# Patient Record
Sex: Male | Born: 1960 | Race: White | Hispanic: No | Marital: Married | Smoking: Former smoker
Health system: Southern US, Community
[De-identification: ages and names within clinical notes are randomized; demographics above are authoritative.]

## PROBLEM LIST (undated history)

## (undated) DIAGNOSIS — E11319 Type 2 diabetes mellitus with unspecified diabetic retinopathy without macular edema: Secondary | ICD-10-CM

## (undated) DIAGNOSIS — E1129 Type 2 diabetes mellitus with other diabetic kidney complication: Secondary | ICD-10-CM

## (undated) DIAGNOSIS — E782 Mixed hyperlipidemia: Secondary | ICD-10-CM

## (undated) DIAGNOSIS — G4733 Obstructive sleep apnea (adult) (pediatric): Secondary | ICD-10-CM

## (undated) DIAGNOSIS — I429 Cardiomyopathy, unspecified: Secondary | ICD-10-CM

## (undated) DIAGNOSIS — I484 Atypical atrial flutter: Secondary | ICD-10-CM

## (undated) DIAGNOSIS — E119 Type 2 diabetes mellitus without complications: Secondary | ICD-10-CM

## (undated) DIAGNOSIS — E669 Obesity, unspecified: Secondary | ICD-10-CM

## (undated) DIAGNOSIS — I10 Essential (primary) hypertension: Secondary | ICD-10-CM

## (undated) DIAGNOSIS — I4819 Other persistent atrial fibrillation: Secondary | ICD-10-CM

## (undated) DIAGNOSIS — R809 Proteinuria, unspecified: Secondary | ICD-10-CM

## (undated) DIAGNOSIS — N200 Calculus of kidney: Secondary | ICD-10-CM

## (undated) HISTORY — DX: Type 2 diabetes mellitus with unspecified diabetic retinopathy without macular edema: E11.319

## (undated) HISTORY — PX: LITHOTRIPSY: SUR834

## (undated) HISTORY — DX: Cardiomyopathy, unspecified: I42.9

## (undated) HISTORY — DX: Obesity, unspecified: E66.9

## (undated) HISTORY — DX: Proteinuria, unspecified: E11.29

## (undated) HISTORY — DX: Atypical atrial flutter: I48.4

## (undated) HISTORY — DX: Type 2 diabetes mellitus with other diabetic kidney complication: R80.9

## (undated) HISTORY — DX: Mixed hyperlipidemia: E78.2

## (undated) HISTORY — DX: Obstructive sleep apnea (adult) (pediatric): G47.33

## (undated) HISTORY — DX: Calculus of kidney: N20.0

---

## 2007-07-02 ENCOUNTER — Encounter: Admission: RE | Admit: 2007-07-02 | Discharge: 2007-07-02 | Payer: Self-pay | Admitting: Family Medicine

## 2011-07-12 ENCOUNTER — Ambulatory Visit (INDEPENDENT_AMBULATORY_CARE_PROVIDER_SITE_OTHER): Payer: BC Managed Care – PPO | Admitting: Ophthalmology

## 2011-07-12 DIAGNOSIS — H43819 Vitreous degeneration, unspecified eye: Secondary | ICD-10-CM

## 2011-07-12 DIAGNOSIS — H2510 Age-related nuclear cataract, unspecified eye: Secondary | ICD-10-CM

## 2011-07-12 DIAGNOSIS — E1139 Type 2 diabetes mellitus with other diabetic ophthalmic complication: Secondary | ICD-10-CM

## 2011-07-12 DIAGNOSIS — E11319 Type 2 diabetes mellitus with unspecified diabetic retinopathy without macular edema: Secondary | ICD-10-CM

## 2012-07-12 ENCOUNTER — Encounter (INDEPENDENT_AMBULATORY_CARE_PROVIDER_SITE_OTHER): Payer: BC Managed Care – PPO | Admitting: Ophthalmology

## 2012-07-12 DIAGNOSIS — H2510 Age-related nuclear cataract, unspecified eye: Secondary | ICD-10-CM

## 2012-07-12 DIAGNOSIS — E1139 Type 2 diabetes mellitus with other diabetic ophthalmic complication: Secondary | ICD-10-CM

## 2012-07-12 DIAGNOSIS — H43819 Vitreous degeneration, unspecified eye: Secondary | ICD-10-CM

## 2012-07-12 DIAGNOSIS — E11319 Type 2 diabetes mellitus with unspecified diabetic retinopathy without macular edema: Secondary | ICD-10-CM

## 2012-07-12 DIAGNOSIS — H35039 Hypertensive retinopathy, unspecified eye: Secondary | ICD-10-CM

## 2012-07-12 DIAGNOSIS — I10 Essential (primary) hypertension: Secondary | ICD-10-CM

## 2013-07-12 ENCOUNTER — Ambulatory Visit (INDEPENDENT_AMBULATORY_CARE_PROVIDER_SITE_OTHER): Payer: BC Managed Care – PPO | Admitting: Ophthalmology

## 2013-07-19 ENCOUNTER — Ambulatory Visit (INDEPENDENT_AMBULATORY_CARE_PROVIDER_SITE_OTHER): Payer: BC Managed Care – PPO | Admitting: Ophthalmology

## 2013-07-19 DIAGNOSIS — E11319 Type 2 diabetes mellitus with unspecified diabetic retinopathy without macular edema: Secondary | ICD-10-CM

## 2013-07-19 DIAGNOSIS — H43819 Vitreous degeneration, unspecified eye: Secondary | ICD-10-CM

## 2013-07-19 DIAGNOSIS — E1139 Type 2 diabetes mellitus with other diabetic ophthalmic complication: Secondary | ICD-10-CM

## 2013-07-19 DIAGNOSIS — I10 Essential (primary) hypertension: Secondary | ICD-10-CM

## 2013-07-19 DIAGNOSIS — H2510 Age-related nuclear cataract, unspecified eye: Secondary | ICD-10-CM

## 2013-07-19 DIAGNOSIS — H35039 Hypertensive retinopathy, unspecified eye: Secondary | ICD-10-CM

## 2014-07-22 ENCOUNTER — Ambulatory Visit (INDEPENDENT_AMBULATORY_CARE_PROVIDER_SITE_OTHER): Payer: BC Managed Care – PPO | Admitting: Ophthalmology

## 2014-07-22 DIAGNOSIS — H35033 Hypertensive retinopathy, bilateral: Secondary | ICD-10-CM

## 2014-07-22 DIAGNOSIS — E11329 Type 2 diabetes mellitus with mild nonproliferative diabetic retinopathy without macular edema: Secondary | ICD-10-CM

## 2014-07-22 DIAGNOSIS — I10 Essential (primary) hypertension: Secondary | ICD-10-CM

## 2014-07-22 DIAGNOSIS — H43813 Vitreous degeneration, bilateral: Secondary | ICD-10-CM

## 2014-07-22 DIAGNOSIS — E11319 Type 2 diabetes mellitus with unspecified diabetic retinopathy without macular edema: Secondary | ICD-10-CM

## 2014-12-03 ENCOUNTER — Encounter (HOSPITAL_COMMUNITY): Payer: Self-pay | Admitting: Emergency Medicine

## 2014-12-03 ENCOUNTER — Emergency Department (HOSPITAL_COMMUNITY)
Admission: EM | Admit: 2014-12-03 | Discharge: 2014-12-03 | Disposition: A | Discharge status: Home / Self Care | Payer: 59 | Attending: Emergency Medicine | Admitting: Emergency Medicine

## 2014-12-03 DIAGNOSIS — I10 Essential (primary) hypertension: Secondary | ICD-10-CM | POA: Insufficient documentation

## 2014-12-03 DIAGNOSIS — Z79899 Other long term (current) drug therapy: Secondary | ICD-10-CM | POA: Diagnosis not present

## 2014-12-03 DIAGNOSIS — N200 Calculus of kidney: Secondary | ICD-10-CM | POA: Insufficient documentation

## 2014-12-03 DIAGNOSIS — R109 Unspecified abdominal pain: Secondary | ICD-10-CM | POA: Diagnosis present

## 2014-12-03 DIAGNOSIS — Z7982 Long term (current) use of aspirin: Secondary | ICD-10-CM | POA: Insufficient documentation

## 2014-12-03 DIAGNOSIS — E119 Type 2 diabetes mellitus without complications: Secondary | ICD-10-CM | POA: Diagnosis not present

## 2014-12-03 DIAGNOSIS — Z720 Tobacco use: Secondary | ICD-10-CM | POA: Insufficient documentation

## 2014-12-03 DIAGNOSIS — Z880 Allergy status to penicillin: Secondary | ICD-10-CM | POA: Diagnosis not present

## 2014-12-03 HISTORY — DX: Type 2 diabetes mellitus without complications: E11.9

## 2014-12-03 HISTORY — DX: Calculus of kidney: N20.0

## 2014-12-03 HISTORY — DX: Essential (primary) hypertension: I10

## 2014-12-03 LAB — LIPASE, BLOOD: LIPASE: 33 U/L (ref 11–59)

## 2014-12-03 LAB — CBC WITH DIFFERENTIAL/PLATELET
Basophils Absolute: 0.1 10*3/uL (ref 0.0–0.1)
Basophils Relative: 1 % (ref 0–1)
Eosinophils Absolute: 0.3 10*3/uL (ref 0.0–0.7)
Eosinophils Relative: 3 % (ref 0–5)
HEMATOCRIT: 42.3 % (ref 39.0–52.0)
HEMOGLOBIN: 14.7 g/dL (ref 13.0–17.0)
LYMPHS PCT: 35 % (ref 12–46)
Lymphs Abs: 3.8 10*3/uL (ref 0.7–4.0)
MCH: 32.2 pg (ref 26.0–34.0)
MCHC: 34.8 g/dL (ref 30.0–36.0)
MCV: 92.6 fL (ref 78.0–100.0)
MONO ABS: 0.7 10*3/uL (ref 0.1–1.0)
Monocytes Relative: 7 % (ref 3–12)
NEUTROS ABS: 6 10*3/uL (ref 1.7–7.7)
Neutrophils Relative %: 54 % (ref 43–77)
Platelets: 302 10*3/uL (ref 150–400)
RBC: 4.57 MIL/uL (ref 4.22–5.81)
RDW: 12.6 % (ref 11.5–15.5)
WBC: 10.9 10*3/uL — AB (ref 4.0–10.5)

## 2014-12-03 LAB — URINE MICROSCOPIC-ADD ON

## 2014-12-03 LAB — COMPREHENSIVE METABOLIC PANEL
ALT: 38 U/L (ref 0–53)
AST: 25 U/L (ref 0–37)
Albumin: 4.3 g/dL (ref 3.5–5.2)
Alkaline Phosphatase: 85 U/L (ref 39–117)
Anion gap: 10 (ref 5–15)
BUN: 11 mg/dL (ref 6–23)
CO2: 23 mmol/L (ref 19–32)
CREATININE: 0.72 mg/dL (ref 0.50–1.35)
Calcium: 9.8 mg/dL (ref 8.4–10.5)
Chloride: 102 mmol/L (ref 96–112)
GFR calc non Af Amer: >90 mL/min (ref >90)
Glucose, Bld: 108 mg/dL — ABNORMAL HIGH (ref 70–99)
Potassium: 3.6 mmol/L (ref 3.5–5.1)
Sodium: 135 mmol/L (ref 135–145)
Total Bilirubin: 0.9 mg/dL (ref 0.3–1.2)
Total Protein: 7.1 g/dL (ref 6.0–8.3)

## 2014-12-03 LAB — URINALYSIS, ROUTINE W REFLEX MICROSCOPIC
BILIRUBIN URINE: NEGATIVE
Glucose, UA: NEGATIVE mg/dL
KETONES UR: NEGATIVE mg/dL
Leukocytes, UA: NEGATIVE
Nitrite: NEGATIVE
PROTEIN: NEGATIVE mg/dL
Specific Gravity, Urine: 1.001 — ABNORMAL LOW (ref 1.005–1.030)
UROBILINOGEN UA: 0.2 mg/dL (ref 0.0–1.0)
pH: 6.5 (ref 5.0–8.0)

## 2014-12-03 MED ORDER — ONDANSETRON 4 MG PO TBDP: 4.0000 mg | ORAL_TABLET | Freq: Three times a day (TID) | ORAL | Status: DC | PRN

## 2014-12-03 MED ORDER — TAMSULOSIN HCL 0.4 MG PO CAPS: 0.4000 mg | ORAL_CAPSULE | Freq: Every day | ORAL | Status: DC

## 2014-12-03 MED ORDER — SODIUM CHLORIDE 0.9 % IV BOLUS (SEPSIS)
1000.0000 mL | Freq: Once | INTRAVENOUS | Status: AC
  Administered 2014-12-03: 1000 mL via INTRAVENOUS

## 2014-12-03 MED ORDER — OXYCODONE-ACETAMINOPHEN 5-325 MG PO TABS: 1.0000 | ORAL_TABLET | Freq: Four times a day (QID) | ORAL | Status: DC | PRN

## 2014-12-03 MED ORDER — OXYCODONE-ACETAMINOPHEN 5-325 MG PO TABS
2.0000 | ORAL_TABLET | Freq: Once | ORAL | Status: AC
  Administered 2014-12-03: 2 via ORAL
  Filled 2014-12-03: qty 2

## 2014-12-03 NOTE — ED Notes (Signed)
Pt reports hx of kidney stones, feels like same today. Pt reports he has to push in order to urinate. Hx of kidney stone blocking urethra. Still able to urinate

## 2014-12-03 NOTE — Discharge Instructions (Signed)
If you develop fever, pain that is uncontrolled, vomiting that will not stop, are unable to urinate, please return to the hospital.   Kidney Stones Kidney stones (urolithiasis) are deposits that form inside your kidneys. The intense pain is caused by the stone moving through the urinary tract. When the stone moves, the ureter goes into spasm around the stone. The stone is usually passed in the urine.  CAUSES   A disorder that makes certain neck glands produce too much parathyroid hormone (primary hyperparathyroidism).  A buildup of uric acid crystals, similar to gout in your joints.  Narrowing (stricture) of the ureter.  A kidney obstruction present at birth (congenital obstruction).  Previous surgery on the kidney or ureters.  Numerous kidney infections. SYMPTOMS   Feeling sick to your stomach (nauseous).  Throwing up (vomiting).  Blood in the urine (hematuria).  Pain that usually spreads (radiates) to the groin.  Frequency or urgency of urination. DIAGNOSIS   Taking a history and physical exam.  Blood or urine tests.  CT scan.  Occasionally, an examination of the inside of the urinary bladder (cystoscopy) is performed. TREATMENT   Observation.  Increasing your fluid intake.  Extracorporeal shock wave lithotripsy--This is a noninvasive procedure that uses shock waves to break up kidney stones.  Surgery may be needed if you have severe pain or persistent obstruction. There are various surgical procedures. Most of the procedures are performed with the use of small instruments. Only small incisions are needed to accommodate these instruments, so recovery time is minimized. The size, location, and chemical composition are all important variables that will determine the proper choice of action for you. Talk to your health care provider to better understand your situation so that you will minimize the risk of injury to yourself and your kidney.  HOME CARE INSTRUCTIONS    Drink enough water and fluids to keep your urine clear or pale yellow. This will help you to pass the stone or stone fragments.  Strain all urine through the provided strainer. Keep all particulate matter and stones for your health care provider to see. The stone causing the pain may be as small as a grain of salt. It is very important to use the strainer each and every time you pass your urine. The collection of your stone will allow your health care provider to analyze it and verify that a stone has actually passed. The stone analysis will often identify what you can do to reduce the incidence of recurrences.  Only take over-the-counter or prescription medicines for pain, discomfort, or fever as directed by your health care provider.  Make a follow-up appointment with your health care provider as directed.  Get follow-up X-rays if required. The absence of pain does not always mean that the stone has passed. It may have only stopped moving. If the urine remains completely obstructed, it can cause loss of kidney function or even complete destruction of the kidney. It is your responsibility to make sure X-rays and follow-ups are completed. Ultrasounds of the kidney can show blockages and the status of the kidney. Ultrasounds are not associated with any radiation and can be performed easily in a matter of minutes. SEEK MEDICAL CARE IF:  You experience pain that is progressive and unresponsive to any pain medicine you have been prescribed. SEEK IMMEDIATE MEDICAL CARE IF:   Pain cannot be controlled with the prescribed medicine.  You have a fever or shaking chills.  The severity or intensity of pain increases over 18  hours and is not relieved by pain medicine.  You develop a new onset of abdominal pain.  You feel faint or pass out.  You are unable to urinate. MAKE SURE YOU:   Understand these instructions.  Will watch your condition.  Will get help right away if you are not doing well  or get worse. Document Released: 07/25/2005 Document Revised: 03/27/2013 Document Reviewed: 12/26/2012 [facility] Patient Information 2015 [place], [place]. This information is not intended to replace advice given to you by your health care provider. Make sure you discuss any questions you have with your health care provider.  Dietary Guidelines to Help Prevent Kidney Stones Your risk of kidney stones can be decreased by adjusting the foods you eat. The most important thing you can do is drink enough fluid. You should drink enough fluid to keep your urine clear or pale yellow. The following guidelines provide specific information for the type of kidney stone you have had. GUIDELINES ACCORDING TO TYPE OF KIDNEY STONE Calcium Oxalate Kidney Stones  Reduce the amount of salt you eat. Foods that have a lot of salt cause your body to release excess calcium into your urine. The excess calcium can combine with a substance called oxalate to form kidney stones.  Reduce the amount of animal protein you eat if the amount you eat is excessive. Animal protein causes your body to release excess calcium into your urine. Ask your dietitian how much protein from animal sources you should be eating.  Avoid foods that are high in oxalates. If you take vitamins, they should have less than 500 mg of vitamin C. Your body turns vitamin C into oxalates. You do not need to avoid fruits and vegetables high in vitamin C. Calcium Phosphate Kidney Stones  Reduce the amount of salt you eat to help prevent the release of excess calcium into your urine.  Reduce the amount of animal protein you eat if the amount you eat is excessive. Animal protein causes your body to release excess calcium into your urine. Ask your dietitian how much protein from animal sources you should be eating.  Get enough calcium from food or take a calcium supplement (ask your dietitian for recommendations). Food sources of calcium that do not increase your  risk of kidney stones include:  Broccoli.  Dairy products, such as cheese and yogurt.  Pudding. Uric Acid Kidney Stones  Do not have more than 6 oz of animal protein per day. FOOD SOURCES Animal Protein Sources  Meat (all types).  Poultry.  Eggs.  Fish, seafood. Foods High in MirantSalt  Salt seasonings.  Soy sauce.  Teriyaki sauce.  Cured and processed meats.  Salted crackers and snack foods.  Fast food.  Canned soups and most canned foods. Foods High in Oxalates  Grains:  Amaranth.  Barley.  Grits.  Wheat germ.  Bran.  Buckwheat flour.  All bran cereals.  Pretzels.  Whole wheat bread.  Vegetables:  Beans (wax).  Beets and beet greens.  Collard greens.  Eggplant.  Escarole.  Leeks.  Okra.  Parsley.  Rutabagas.  Spinach.  Swiss chard.  Tomato paste.  Fried potatoes.  Sweet potatoes.  Fruits:  Red currants.  Figs.  Kiwi.  Rhubarb.  Meat and Other Protein Sources:  Beans (dried).  Soy burgers and other soybean products.  Miso.  Nuts (peanuts, almonds, pecans, cashews, hazelnuts).  Nut butters.  Sesame seeds and tahini (paste made of sesame seeds).  Poppy seeds.  Beverages:  Chocolate drink mixes.  Soy  milk.  Instant iced tea.  Juices made from high-oxalate fruits or vegetables.  Other:  Carob.  Chocolate.  Fruitcake.  Marmalades. Document Released: 11/19/2010 Document Revised: 07/30/2013 Document Reviewed: 06/21/2013 [facility] Patient Information 2015 [place], [place]. This information is not intended to replace advice given to you by your health care provider. Make sure you discuss any questions you have with your health care provider.

## 2014-12-03 NOTE — ED Notes (Signed)
Pt reports known kidney stone x4 weeks, today began having intermittent lower pelvic pain w/ associated dysuria, pt states he has difficulty urinating and produces only a small amount of urine at a time. Pt admits to hx of having kidney stones stuck lower in his urinary track causing dysuria. Pt admits to pain w/ movement and urination. Pt in no acute distress on assessment, A&Ox4.

## 2014-12-03 NOTE — ED Provider Notes (Signed)
TIME SEEN: 8:05 PM  CHIEF COMPLAINT: Right flank pain  HPI: Pt is a 54 y.o. male with history of kidney stones, hypertension, diabetes who presents to the emergency department with penile pain, dysuria. States that 4 weeks ago he was having right flank pain that he felt like was similar to his prior kidney stones. He states that he drank large amounts of water and felt like the stone moved into his bladder. He states that he has not noticed the stone passage today he began drug and water again and then fell at the stone was moving into his urethra. He states however the pain is Worse and is worse with urination and now he is having a hard time urinating and has to push hard to be able to urinate. He denies that he is having any fevers, chills, nausea, vomiting or diarrhea. States he has had multiple kidney stones. Has had prior ureteral stents. Has also had a previous obstructing [place] of [place] and [place] undergo procedure to break the stone up.  [place]. Denies penile discharge, testicular pain or swelling.  ROS: See HPI Constitutional: no fever  Eyes: no drainage  ENT: no runny nose   Cardiovascular:  no chest pain  Resp: no SOB  GI: no vomiting GU: no dysuria Integumentary: no rash  Allergy: no hives  Musculoskeletal: no leg swelling  Neurological: no slurred speech ROS otherwise negative  PAST MEDICAL HISTORY/PAST SURGICAL HISTORY:  Past Medical History  Diagnosis Date  . Kidney stone   . Diabetes mellitus without complication   . Hypertension     MEDICATIONS:  Prior to Admission medications   Medication Sig Start Date End Date Taking? Authorizing Provider  aspirin EC 81 MG tablet Take 81 mg by mouth daily.   Yes Historical Provider, MD  Flaxseed, Linseed, (EQL FLAX SEED OIL) 1000 MG CAPS Take 2,000 mg by mouth 2 (two) times daily.   Yes Historical Provider, MD  lisinopril (PRINIVIL,ZESTRIL) 20 MG tablet Take 20  mg by mouth 2 (two) times daily.   Yes Historical Provider, MD  metFORMIN (GLUCOPHAGE) 1000 MG tablet Take 1,000 mg by mouth 2 (two) times daily with a meal.   Yes Historical Provider, MD  Multiple Vitamin (MULTIVITAMIN WITH MINERALS) TABS tablet Take 1 tablet by mouth daily.   Yes Historical Provider, MD  potassium citrate (UROCIT-K) 10 MEQ (1080 MG) SR tablet Take 20 mEq by mouth 2 (two) times daily.   Yes Historical Provider, MD  VIAGRA 50 MG tablet Take 1 tablet by mouth daily as needed (prior to intercourse.).  09/08/14  Yes Historical Provider, MD    ALLERGIES:  Allergies  Allergen Reactions  . Penicillins Swelling    SOCIAL HISTORY:  History  Substance Use Topics  . Smoking status: Current Every Day Smoker  . Smokeless tobacco: Not on file  . Alcohol Use: No    FAMILY HISTORY: History reviewed. No pertinent family history.  EXAM: BP 116/85 mmHg  Pulse 63  Temp(Src) 97.7 F (36.5 C) (Oral)  Resp 20  SpO2 97% CONSTITUTIONAL: Alert and oriented and responds appropriately to questions. Well-appearing; well-nourished HEAD: Normocephalic EYES: Conjunctivae clear, PERRL ENT: normal nose; no rhinorrhea; moist mucous membranes; pharynx without lesions noted NECK: Supple, no meningismus, no LAD  CARD: RRR; S1 and S2 appreciated; no murmurs, no clicks, no rubs, no gallops RESP: Normal chest excursion without splinting or tachypnea; breath sounds clear and equal bilaterally;  no wheezes, no rhonchi, no rales ABD/GI: Normal bowel sounds; non-distended; soft, non-tender, no rebound, no guarding GU:  Normal external genitalia, circumcised male, no penile discharge, normal urethral meatus, no testicular masses or tenderness, no scrotal masses, 2+ femoral pulses bilaterally, no hernia BACK:  The back appears normal and is non-tender to palpation, there is no CVA tenderness EXT: Normal ROM in all joints; non-tender to palpation; no edema; normal capillary refill; no cyanosis    SKIN:  Normal color for age and race; warm NEURO: Moves all extremities equally PSYCH: The patient's mood and manner are appropriate. Grooming and personal hygiene are appropriate.  MEDICAL DECISION MAKING: Patient here with what he feels like is a urethral stone. He is able to urinate but states is very difficult. He has a mild leukocytosis today. His creatinine is normal. Urine shows hemoglobin without other sign of infection. We'll discuss with urology on call for recommendations. We'll give IV fluids. He denies pain and nausea medicine at this time.  ED PROGRESS:   Discussed with Dr. Laural Benes with urology. He feels that given the patient is able to empty his bladder and has normal creatinine, pain control, no vomiting, afebrile that he can follow-up as an outpatient. Discussed this with patient who is okay with this plan. Will give dose of pain medicine in the ED as he states that it is painful when he walks. Have discussed with urology whether not we should get imaging today and they state they will follow the patient up as an outpatient and can decide if he needs imaging at that time. Dr. Laural Benes requesting a post void residual.   Patient visualized 126 mL. He reports if it was greater than 300 patient may need to be taken to the operating room tonight but he feels the patient can be followed as an outpatient. Discussed return precautions. He verbalizes understanding and is comfortable with plan.     Layla Maw Tyshawn Keel, DO 12/03/14 2102

## 2015-03-31 ENCOUNTER — Encounter: Payer: Self-pay | Admitting: Dietician

## 2015-03-31 ENCOUNTER — Encounter: Payer: 59 | Attending: Family Medicine | Admitting: Dietician

## 2015-03-31 VITALS — Ht 72.0 in | Wt 279.0 lb

## 2015-03-31 DIAGNOSIS — Z713 Dietary counseling and surveillance: Secondary | ICD-10-CM | POA: Diagnosis not present

## 2015-03-31 DIAGNOSIS — Z6837 Body mass index (BMI) 37.0-37.9, adult: Secondary | ICD-10-CM | POA: Insufficient documentation

## 2015-03-31 DIAGNOSIS — E669 Obesity, unspecified: Secondary | ICD-10-CM | POA: Diagnosis not present

## 2015-03-31 DIAGNOSIS — E119 Type 2 diabetes mellitus without complications: Secondary | ICD-10-CM

## 2015-03-31 NOTE — Progress Notes (Signed)
Diabetes Self-Management Education  Visit Type: First/Initial  Appt. Start Time: 1515 Appt. End Time: 1630  03/31/2015  Mr. Jose Cain, identified by name and date of birth, is a 54 y.o. male with a diagnosis of Diabetes: Type 2. He has had type 2 diabetes for about 10 years.  His father has type 1 diabetes and uses an insulin pump.  Hx includes HTN,and obesity.    Patient lives with his wife.  Both shop and cook.  He states that he is confused about what foods are good to eat and what are not to control his blood sugar and lose weight.  He lost weight in the past but then regained it all over time.  They have started walking each evening after work for 30-45 minutes consistently either in the neighborhood or on the treadmill or bike at home.  He has lost from 295 lbs in January to current weight of 279 lbs.  He stated that he believes that he can eat a small amount of what he wishes otherwise he tends to feel deprived and overeat that item.   He continues to smoke.  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  Discussed healthy diet, weight loss, and continued exercise as means to improve diabetes, HTN, and weight.  ASSESSMENT  Height 6' (1.829 m), weight 279 lb (126.554 kg). Body mass index is 37.83 kg/(m^2).      Diabetes Self-Management Education - 03/31/15 1636    Visit Information   Visit Type First/Initial   Initial Visit   Diabetes Type Type 2   Are you currently following a meal plan? No   Are you taking your medications as prescribed? Yes  used to forget on the weekends but doing much better with this at this time.  Takes Metformin on empty stomach but no side effects   Date Diagnosed about 10 years ago   Health Coping   How would you rate your overall health? Excellent   Psychosocial Assessment   Patient Belief/Attitude about Diabetes Other (comment)  I don't really think about it that much.   Self-care barriers None   Self-management support Doctor's office;Family   Other persons present Patient;Spouse/SO   Patient Concerns Nutrition/Meal planning;Weight Control   Special Needs None   Preferred Learning Style No preference indicated   Learning Readiness Ready   How often do you need to have someone help you when you read instructions, pamphlets, or other written materials from your doctor or pharmacy? 1 - Never   What is the last grade level you completed in school? 2 years college   Complications   Last HgB A1C per patient/outside source 7.3 %  02/19/15   How often do you check your blood sugar? 1-2 times/day  5 times per week   Fasting Blood glucose range (mg/dL) 96-045   Number of hypoglycemic episodes per month 0   Number of hyperglycemic episodes per week 0   Have you had a dilated eye exam in the past 12 months? Yes   Have you had a dental exam in the past 12 months? Yes   Are you checking your feet? Yes   How many days per week are you checking your feet? 7   Dietary Intake   Breakfast skips as he is not hungry or bowl of canteloupe   Snack (morning) none   Lunch boiled egg, yogurt, fruit or sandwich or soup or salad brought from home  Snack (afternoon) pickle, spoon of a leftover when he gets home from work.  He used to eat NABs but does not buy them nor miss them   Dinner meat and vegetables   Snack (evening) Used to buy a large bag of chips and eat all of it.  NOW 1 scoop of ice cream or a cookie occasionally   Beverage(s) water, occasional lemonade   Exercise   Exercise Type Light (walking / raking leaves)   How many days per week to you exercise? 7   How many minutes per day do you exercise? 30  30-45   Total minutes per week of exercise 210   Patient Education   Previous Diabetes Education No   Disease state  Definition of diabetes, type 1 and 2, and the diagnosis of diabetes   Nutrition management  Role of diet in the treatment of diabetes and the relationship between the three  main macronutrients and blood glucose level;Food label reading, portion sizes and measuring food.;Carbohydrate counting;Reviewed blood glucose goals for pre and post meals and how to evaluate the patients' food intake on their blood glucose level.   Physical activity and exercise  Role of exercise on diabetes management, blood pressure control and cardiac health.   Medications Reviewed patients medication for diabetes, action, purpose, timing of dose and side effects.   Monitoring Identified appropriate SMBG and/or A1C goals.;Daily foot exams;Yearly dilated eye exam;Other (comment)  evaluating blood sugar based on different meal composition   Chronic complications Relationship between chronic complications and blood glucose control;Lipid levels, blood glucose control and heart disease   Personal strategies to promote health Review risk of smoking and offered smoking cessation   Individualized Goals (developed by patient)   Nutrition General guidelines for healthy choices and portions discussed   Physical Activity Exercise 5-7 days per week;30 minutes per day;45 minutes per day   Medications take my medication as prescribed   Monitoring  test my blood glucose as discussed   Reducing Risk stop smoking;do foot checks daily   Health Coping Not Applicable   Outcomes   Expected Outcomes Demonstrated interest in learning. Expect positive outcomes   Future DMSE PRN   Program Status Completed      Individualized Plan for Diabetes Self-Management Training:   Learning Objective:  Patient will have a greater understanding of diabetes self-management. Patient education plan is to attend individual and/or group sessions per assessed needs and concerns.   Plan:   Patient Instructions  Randie Heinz job on the changes that you have made!  Regular exercise and changing what you drink. Continue the exercise. Recommend eating breakfast.   Have protein in moderation with each meal and snack. Aim for 30-45 grams  carbohydrates at each meal. Aim for 0-15 grams of carbohydrate at each snack.   Expected Outcomes:  Demonstrated interest in learning. Expect positive outcomes  Education material provided: Living Well with Diabetes, Food label handouts, A1C conversion sheet, Meal plan card and Snack sheet , breakfast ideas  If problems or questions, patient to contact team via:  Phone and Email  Future DSME appointment: PRN

## 2015-03-31 NOTE — Patient Instructions (Signed)
Great job on the changes that you have made!  Regular exercise and changing what you drink. Continue the exercise. Recommend eating breakfast.   Have protein in moderation with each meal and snack. Aim for 30-45 grams carbohydrates at each meal. Aim for 0-15 grams of carbohydrate at each snack.

## 2015-07-24 ENCOUNTER — Ambulatory Visit (INDEPENDENT_AMBULATORY_CARE_PROVIDER_SITE_OTHER): Payer: 59 | Admitting: Ophthalmology

## 2015-07-24 DIAGNOSIS — I10 Essential (primary) hypertension: Secondary | ICD-10-CM | POA: Diagnosis not present

## 2015-07-24 DIAGNOSIS — H35033 Hypertensive retinopathy, bilateral: Secondary | ICD-10-CM | POA: Diagnosis not present

## 2015-07-24 DIAGNOSIS — E11319 Type 2 diabetes mellitus with unspecified diabetic retinopathy without macular edema: Secondary | ICD-10-CM | POA: Diagnosis not present

## 2015-07-24 DIAGNOSIS — H43813 Vitreous degeneration, bilateral: Secondary | ICD-10-CM | POA: Diagnosis not present

## 2015-07-24 DIAGNOSIS — E113293 Type 2 diabetes mellitus with mild nonproliferative diabetic retinopathy without macular edema, bilateral: Secondary | ICD-10-CM | POA: Diagnosis not present

## 2016-07-26 ENCOUNTER — Ambulatory Visit (INDEPENDENT_AMBULATORY_CARE_PROVIDER_SITE_OTHER): Payer: 59 | Admitting: Ophthalmology

## 2016-07-26 DIAGNOSIS — H43813 Vitreous degeneration, bilateral: Secondary | ICD-10-CM | POA: Diagnosis not present

## 2016-07-26 DIAGNOSIS — E11319 Type 2 diabetes mellitus with unspecified diabetic retinopathy without macular edema: Secondary | ICD-10-CM | POA: Diagnosis not present

## 2016-07-26 DIAGNOSIS — I10 Essential (primary) hypertension: Secondary | ICD-10-CM

## 2016-07-26 DIAGNOSIS — E113293 Type 2 diabetes mellitus with mild nonproliferative diabetic retinopathy without macular edema, bilateral: Secondary | ICD-10-CM | POA: Diagnosis not present

## 2016-07-26 DIAGNOSIS — H35033 Hypertensive retinopathy, bilateral: Secondary | ICD-10-CM

## 2017-07-26 ENCOUNTER — Ambulatory Visit (INDEPENDENT_AMBULATORY_CARE_PROVIDER_SITE_OTHER): Payer: 59 | Admitting: Ophthalmology

## 2017-07-26 DIAGNOSIS — H35033 Hypertensive retinopathy, bilateral: Secondary | ICD-10-CM

## 2017-07-26 DIAGNOSIS — I10 Essential (primary) hypertension: Secondary | ICD-10-CM

## 2017-07-26 DIAGNOSIS — H2513 Age-related nuclear cataract, bilateral: Secondary | ICD-10-CM | POA: Diagnosis not present

## 2017-07-26 DIAGNOSIS — E113293 Type 2 diabetes mellitus with mild nonproliferative diabetic retinopathy without macular edema, bilateral: Secondary | ICD-10-CM

## 2017-07-26 DIAGNOSIS — E11319 Type 2 diabetes mellitus with unspecified diabetic retinopathy without macular edema: Secondary | ICD-10-CM

## 2017-07-26 DIAGNOSIS — H43813 Vitreous degeneration, bilateral: Secondary | ICD-10-CM

## 2017-12-08 NOTE — H&P (Signed)
Jose Cain 17-Dec-2017 9:00 AM Location: [facility] Patient #: [id number] DOB: 1961/03/05 Married / Language: [place] / Race: White Male   History of Present Illness Jose Clines Amillia Biffle MD; Dec 17, 2017 10:11 AM) Patient words: NP EVAL for lower extremity edema.  The patient is a 57 year old male who presents for shortness of breath. Patient referred for evaluation of by Brett Fairy, MD  57 year old Caucasian male with hypertension, controlled type 2 DM, moderate obesity, OSA on CPAP. 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 He is noticed shortness of breath on walking uphill since December. He says he had gained a lot of weight, which has come down with use of Lasix. I'll his exertional dyspnea is somewhat better, but still persists. He has noticed bilateral leg edema, again somewhat better after using Lasix. He denies any palpitations, chest pain, orthopnea, PND.  He is a former smoker, quit in 2016. He has family history of CAD in his mother. Patient had a screening colonoscopy 4 years ago. He was found to have some polyps but did not undergo any polypectomy. He was recommended to have colonoscopy done sometime later this year.   Problem List/Past Medical Osvaldo Human Johnson; 12/17/17 9:08 AM) Laboratory examination (Z01.89)  Benign essential hypertension (I10)  Controlled type 2 diabetes mellitus without complication, without long-term current use of insulin (E11.9)   Allergies Osvaldo Human Johnson; Dec 17, 2017 9:09 AM) penicillAMINE *MISCELLANEOUS THERAPEUTIC CLASSES*  rash  Family History Osvaldo Human Johnson; Dec 17, 2017 9:10 AM) Mother  Deceased. at age 7; no heart attacks/strokes; Angioplasty Father  In good health. 72 , DM; no heart issues Brother 1  In good health. older; no heart issues  Social History Elease Etienne; 12/17/17 9:15  AM) Current tobacco use  Former smoker. quit 2016; somed about 1/2 pack daily Alcohol Use  Occasional alcohol use, Drinks wine. Marital status  Married. Living Situation  Lives with spouse. Number of Children  3.  Past Surgical History Elease Etienne; 12-17-17 9:16 AM) None [2017-12-17]:  Medication History Osvaldo Human Johnson; 12/17/2017 9:15 AM) Furosemide (  Tablet, Oral as needed) Active. Losartan Potassium (  Tablet, 1 Oral daily) Active. amLODIPine Besylate (  Tablet, 1 Oral daily) Active. metFORMIN HCl (  Tablet, 1 Oral two times daily) Active. Aspirin (  Capsule, 1 Oral daily) Active. Cetirizine HCl (  Tablet, 1 Oral daily) Active. Flaxseed Oil (2 Oral two times daily) Specific strength unknown - Active. Multivitamin Adults 50+ (1 Oral daily) Active. Medications Reconciled (meds present)  Diagnostic Studies History Osvaldo Human Johnson; Dec 17, 2017 9:16 AM) Colonoscopy [2015]: few polyphs Echocardiogram [2009]:    Review of Systems [facility] Jianni Shelden MD; Dec 17, 2017 10:10 AM) General Not Present- Appetite Loss and Weight Gain. Respiratory Not Present- Chronic Cough and Wakes up from Sleep Wheezing or Short of Breath. Cardiovascular Present- Difficulty Breathing On Exertion, Edema and Hypertension (Controlled). Not Present- Chest Pain, Claudications, Difficulty Breathing Lying Down, Orthopnea, Palpitations and Rapid Heart Rate (not perceieved by the patient.). Gastrointestinal Not Present- Black, Tarry Stool and Difficulty Swallowing. Musculoskeletal Not Present- Decreased Range of Motion and Muscle Atrophy. Neurological Not Present- Attention Deficit. Psychiatric Not Present- Personality Changes and Suicidal Ideation. Endocrine Not Present- Cold Intolerance and Heat Intolerance. Hematology Not Present- Abnormal Bleeding. All other systems negative  Vitals Osvaldo Human Johnson; 12-17-17 9:11 AM) 2017/12/17 9:05 AM Weight: 281 lb Height: 72in Body  Surface Area: 2.46 m Body Mass Index: 38.11  kg/m  Pulse: 157 (Regular)  P.OX: 98% (Room air) BP: 118/68 (Sitting, Left Arm, Standard)       Physical Exam Jose Clines Elfriede Bonini MD; 12/08/2017 10:11 AM) General Mental Status-Alert. General Appearance-Cooperative and Appears stated age. Build & Nutrition-Moderately obese.  Head and Neck Thyroid Gland Characteristics - normal size and consistency and no palpable nodules. Note: Large neck. Malampatti score 2   Chest and Lung Exam Chest and lung exam reveals -quiet, even and easy respiratory effort with no use of accessory muscles, non-tender and on auscultation, normal breath sounds, no adventitious sounds.  Cardiovascular Cardiovascular examination reveals -normal heart sounds, regular rate and rhythm with no murmurs, carotid auscultation reveals no bruits, abdominal aorta auscultation reveals no bruits and no prominent pulsation and femoral artery auscultation bilaterally reveals normal pulses, no bruits, no thrills.  Abdomen Palpation/Percussion Palpation and Percussion of the abdomen reveal - Non Tender and No hepatosplenomegaly.  Peripheral Vascular Lower Extremity Palpation - Edema - Bilateral - 2+ Pitting edema. Dorsalis pedis pulse - Bilateral - 2+. Posterior tibial pulse - Bilateral - 1+. Carotid arteries - Bilateral-No Carotid bruit.  Neurologic Neurologic evaluation reveals -alert and oriented x 3 with no impairment of recent or remote memory. Motor-Grossly intact without any focal deficits.  Musculoskeletal Global Assessment Left Lower Extremity - no deformities, masses or tenderness, no known fractures. Right Lower Extremity - no deformities, masses or tenderness, no known fractures.    Assessment & Plan [facility] Azaryah Oleksy MD; 12/08/2017 11:05 AM) Atrial flutter with rapid ventricular response (I48.92) Story: EKG 12/08/2017: Atypical atrial flutter with variable conduction. Average ventricular  rate 140 bpm. Right bundle branch block, left anterior fascicular block. Nonspecific ST-T changes. Low-voltage.  CHA2DS2VASc score 2: Annual stroke risk 2.2% Current Plans Started Xarelto , 1 (one) Tablet Once daily, #30, 30 days starting 12/08/2017, Ref. x2. Started dilTIAZem HCl ER , 1 (one) Capsule once daily, #60, 60 days starting 12/08/2017, Ref. x3. METABOLIC PANEL, BASIC [phone]) Complete electrocardiogram (93000) Future Plans 12/22/2017: Myocardial perfusion imaging, tomographic (SPECT) (including attenuation correction, qualitative or quantitative wall motion, ejection fraction by first pass or gated technique, additional quantification, when performed) - one time 12/29/2017: Echocardiography, transthoracic, real-time with image documentation (2D), includes M-mode recording, when performed, complete, with spectral Doppler echocardiography, and with color flow Doppler echocardiography (60454) - one time OSA on CPAP (G47.33) Benign essential hypertension (I10) Controlled type 2 diabetes mellitus without complication, without long-term current use of insulin (E11.9) Moderate obesity (E66.8)  Note:Recommendations:  57 year old Caucasian male with hypertension, controlled type 2 DM, moderate obesity, OSA on CPAP.  New diagnosis of atypical atrial flutter with RVR.  History is factors are hypertension, type II diabetes mellitus, morbid obesity and also to sleep apnea. With his moderate stroke risk with CHADSVASc Score of 2, I recommend anticoagulation with Xarelto 20 mg daily. I discussed the options of various rhythm control and rate control strategies with the patient. But this atypical flutter, this may not be amenable to ablation. Nevertheless, we can consider ablation down the road. However, at this time I would recommend proceeding with TEE guided cardioversion next week, in light of his RVR. He does have right bundle branch block and left atrial vascular block while in  atrial flutter RVR. However, his AV conduction seems to be intact. I discussed the small risk of complete heart block and requiring pacemaker with the patient. He does have a large neck, but his Malampatti score is 2. I think he will do well with deep sedation, and will be  monitored by anesthesiology. In the meantime, I have started him on diltiazem 120 mg once daily.  After TEE guided cardioversion, I will perform an outpatient echocardiogram and exercise nuclear stress test to evaluate his EF while in sinus rhythm, and to rule out myocardial ischemia.   In the long term, he will need optimal control of his risk factors to avoid recurrence. Continue using CPAP. Needs aggressive weight loss.  I will see him after TEE/cardioversion, echo and stress test.  Cc Remi Deter, MD  Signed electronically by Truett Mainland, MD (12/08/2017 11:05 AM)

## 2017-12-11 NOTE — Progress Notes (Signed)
Labs 12/08/2017: Glucose 13.  BUN/creatinine 13/0.7.  EGFR normal.  Sodium 141, potassium 4.2

## 2017-12-12 ENCOUNTER — Ambulatory Visit (HOSPITAL_COMMUNITY)
Admission: RE | Admit: 2017-12-12 | Discharge: 2017-12-12 | Disposition: A | Discharge status: Home / Self Care | Payer: 59 | Source: Ambulatory Visit | Attending: Cardiology | Admitting: Cardiology

## 2017-12-12 ENCOUNTER — Encounter (HOSPITAL_COMMUNITY)
Admission: RE | Disposition: A | Discharge status: Home / Self Care | Payer: Self-pay | Source: Ambulatory Visit | Attending: Cardiology

## 2017-12-12 ENCOUNTER — Ambulatory Visit (HOSPITAL_COMMUNITY): Payer: 59 | Admitting: Certified Registered"

## 2017-12-12 ENCOUNTER — Other Ambulatory Visit: Payer: Self-pay

## 2017-12-12 ENCOUNTER — Encounter (HOSPITAL_COMMUNITY): Payer: Self-pay | Admitting: *Deleted

## 2017-12-12 ENCOUNTER — Ambulatory Visit (HOSPITAL_COMMUNITY): Payer: 59

## 2017-12-12 DIAGNOSIS — Z79899 Other long term (current) drug therapy: Secondary | ICD-10-CM | POA: Diagnosis not present

## 2017-12-12 DIAGNOSIS — E119 Type 2 diabetes mellitus without complications: Secondary | ICD-10-CM | POA: Insufficient documentation

## 2017-12-12 DIAGNOSIS — Z7982 Long term (current) use of aspirin: Secondary | ICD-10-CM | POA: Insufficient documentation

## 2017-12-12 DIAGNOSIS — Z7984 Long term (current) use of oral hypoglycemic drugs: Secondary | ICD-10-CM | POA: Diagnosis not present

## 2017-12-12 DIAGNOSIS — Z8249 Family history of ischemic heart disease and other diseases of the circulatory system: Secondary | ICD-10-CM | POA: Diagnosis not present

## 2017-12-12 DIAGNOSIS — Z87891 Personal history of nicotine dependence: Secondary | ICD-10-CM | POA: Insufficient documentation

## 2017-12-12 DIAGNOSIS — Z6837 Body mass index (BMI) 37.0-37.9, adult: Secondary | ICD-10-CM | POA: Insufficient documentation

## 2017-12-12 DIAGNOSIS — I4892 Unspecified atrial flutter: Secondary | ICD-10-CM | POA: Diagnosis present

## 2017-12-12 DIAGNOSIS — I10 Essential (primary) hypertension: Secondary | ICD-10-CM | POA: Insufficient documentation

## 2017-12-12 HISTORY — PX: CARDIOVERSION: SHX1299

## 2017-12-12 HISTORY — PX: TEE WITHOUT CARDIOVERSION: SHX5443

## 2017-12-12 LAB — GLUCOSE, CAPILLARY: Glucose-Capillary: 106 mg/dL — ABNORMAL HIGH (ref 65–99)

## 2017-12-12 SURGERY — CARDIOVERSION
Anesthesia: Monitor Anesthesia Care

## 2017-12-12 MED ORDER — LIDOCAINE HCL (CARDIAC) PF 100 MG/5ML IV SOSY
PREFILLED_SYRINGE | INTRAVENOUS | Status: DC | PRN
  Administered 2017-12-12: 40 mg via INTRAVENOUS

## 2017-12-12 MED ORDER — SODIUM CHLORIDE 0.9 % IV SOLN: INTRAVENOUS | Status: DC

## 2017-12-12 MED ORDER — PHENYLEPHRINE HCL 10 MG/ML IJ SOLN
INTRAMUSCULAR | Status: DC | PRN
Start: 2017-12-12 — End: 2017-12-12
  Administered 2017-12-12: 80 ug via INTRAVENOUS

## 2017-12-12 MED ORDER — LACTATED RINGERS IV SOLN
INTRAVENOUS | Status: DC | PRN
  Administered 2017-12-12: 13:00:00 via INTRAVENOUS

## 2017-12-12 MED ORDER — METOPROLOL TARTRATE 5 MG/5ML IV SOLN
INTRAVENOUS | Status: DC | PRN
  Administered 2017-12-12: 2.5 mg via INTRAVENOUS

## 2017-12-12 MED ORDER — PROPOFOL 10 MG/ML IV BOLUS
INTRAVENOUS | Status: DC | PRN
  Administered 2017-12-12: 40 mg via INTRAVENOUS

## 2017-12-12 MED ORDER — PROPOFOL 500 MG/50ML IV EMUL
INTRAVENOUS | Status: DC | PRN
  Administered 2017-12-12: 100 ug/kg/min via INTRAVENOUS

## 2017-12-12 NOTE — Progress Notes (Signed)
  Echocardiogram Echocardiogram Transesophageal has been performed.  Jose Cain G Jose Cain 12/12/2017, 2:30 PM

## 2017-12-12 NOTE — Discharge Instructions (Signed)

## 2017-12-12 NOTE — Anesthesia Postprocedure Evaluation (Signed)
Anesthesia Post Note  Patient: Shahiem Bedwell  Procedure(s) Performed: CARDIOVERSION (N/A ) TRANSESOPHAGEAL ECHOCARDIOGRAM (TEE) (N/A )     Patient location during evaluation: Endoscopy Anesthesia Type: MAC Level of consciousness: awake and alert Pain management: pain level controlled Vital Signs Assessment: post-procedure vital signs reviewed and stable Respiratory status: spontaneous breathing, nonlabored ventilation, respiratory function stable and patient connected to nasal cannula oxygen Cardiovascular status: stable and blood pressure returned to baseline Postop Assessment: no apparent nausea or vomiting Anesthetic complications: no    Last Vitals:  Vitals:   12/12/17 1440 12/12/17 1450  BP: 105/73 112/77  Pulse: 97 97  Resp: 15 15  Temp:    SpO2: 98% 98%    Last Pain:  Vitals:   12/12/17 1450  TempSrc:   PainSc: 0-No pain                 Barnet Glasgow

## 2017-12-12 NOTE — Interval H&P Note (Signed)
History and Physical Interval Note:  12/12/2017 1:04 PM  Jose Cain  has presented today for surgery, with the diagnosis of aflutter  The various methods of treatment have been discussed with the patient and family. After consideration of risks, benefits and other options for treatment, the patient has consented to  Procedure(s): CARDIOVERSION (N/A) TRANSESOPHAGEAL ECHOCARDIOGRAM (TEE) (N/A) as a surgical intervention .  The patient's history has been reviewed, patient examined, no change in status, stable for surgery.  I have reviewed the patient's chart and labs.  Questions were answered to the patient's satisfaction.     Jose Cain J Xiana Carns

## 2017-12-12 NOTE — Anesthesia Preprocedure Evaluation (Signed)
Anesthesia Evaluation  Patient identified by MRN, date of birth, ID band Patient awake    Reviewed: Allergy & Precautions, NPO status , Patient's Chart, lab work & pertinent test results  Airway Mallampati: II  TM Distance: >3 FB Neck ROM: Full    Dental no notable dental hx.    Pulmonary sleep apnea and Continuous Positive Airway Pressure Ventilation ,    Pulmonary exam normal breath sounds clear to auscultation       Cardiovascular Exercise Tolerance: Good hypertension, Pt. on medications Normal cardiovascular exam Rhythm:Regular Rate:Normal     Neuro/Psych negative neurological ROS  negative psych ROS   GI/Hepatic negative GI ROS, Neg liver ROS,   Endo/Other  diabetes, Type 2Morbid obesity  Renal/GU      Musculoskeletal   Abdominal (+) + obese,   Peds negative pediatric ROS (+)  Hematology negative hematology ROS (+)   Anesthesia Other Findings All: PCN  Reproductive/Obstetrics                             Anesthesia Physical Anesthesia Plan  ASA: III  Anesthesia Plan: MAC   Post-op Pain Management:    Induction:   PONV Risk Score and Plan:   Airway Management Planned: Mask, Natural Airway and Nasal Cannula  Additional Equipment:   Intra-op Plan:   Post-operative Plan:   Informed Consent: I have reviewed the patients History and Physical, chart, labs and discussed the procedure including the risks, benefits and alternatives for the proposed anesthesia with the patient or authorized representative who has indicated his/her understanding and acceptance.     Plan Discussed with:   Anesthesia Plan Comments:         Anesthesia Quick Evaluation

## 2017-12-12 NOTE — CV Procedure (Signed)
TEE: Under moderate sedation, TEE was performed without complications: LV: Normal. Normal EF. RV: Normal LA: Normal. Left atrial appendage: Normal without thrombus. Normal function. Inter atrial septum is intact without defect. Double contrast study negative for atrial level shunting. No late appearance of bubbles either. RA: Normal  MV: Mild to moderate MR. TV: Moderate TR AV: Normal. No AI or AS. PV: Normal. Trace PI.  Thoracic and ascending aorta: Normal without significant plaque or atheromatous changes.  Deep sedation administered and monitored by anesthesiology  Direct current cardioversion:  Indication symptomatic Atrial flutter  Deep sedation administered and monitored by anesthesiology. Patient was delivered with 120 Joules of electricity X 2 with success to probable sinus rhythm with first degree AV block, RBBB, LAFB.Marland KitchenPatient tolerated the procedure well. No immediate complication noted.

## 2017-12-12 NOTE — Transfer of Care (Signed)
Immediate Anesthesia Transfer of Care Note  Patient: Jose Cain  Procedure(s) Performed: CARDIOVERSION (N/A ) TRANSESOPHAGEAL ECHOCARDIOGRAM (TEE) (N/A )  Patient Location: Endoscopy Unit  Anesthesia Type:MAC  Level of Consciousness: awake, alert  and oriented  Airway & Oxygen Therapy: Patient Spontanous Breathing and Patient connected to nasal cannula oxygen  Post-op Assessment: Report given to RN, Post -op Vital signs reviewed and stable and Patient moving all extremities X 4  Post vital signs: Reviewed and stable  Last Vitals:  Vitals Value Taken Time  BP    Temp    Pulse    Resp    SpO2      Last Pain:  Vitals:   12/12/17 1310  PainSc: 0-No pain         Complications: No apparent anesthesia complications

## 2017-12-15 ENCOUNTER — Encounter (HOSPITAL_COMMUNITY): Payer: Self-pay | Admitting: Cardiology

## 2018-01-04 DIAGNOSIS — I4891 Unspecified atrial fibrillation: Secondary | ICD-10-CM

## 2018-01-04 DIAGNOSIS — E119 Type 2 diabetes mellitus without complications: Secondary | ICD-10-CM

## 2018-01-04 DIAGNOSIS — Z9989 Dependence on other enabling machines and devices: Secondary | ICD-10-CM

## 2018-01-04 DIAGNOSIS — I4892 Unspecified atrial flutter: Secondary | ICD-10-CM

## 2018-01-04 DIAGNOSIS — E668 Other obesity: Secondary | ICD-10-CM

## 2018-01-04 DIAGNOSIS — G4733 Obstructive sleep apnea (adult) (pediatric): Secondary | ICD-10-CM

## 2018-01-04 DIAGNOSIS — I10 Essential (primary) hypertension: Secondary | ICD-10-CM

## 2018-01-06 ENCOUNTER — Inpatient Hospital Stay (HOSPITAL_COMMUNITY)
Admission: AD | Admit: 2018-01-06 | Discharge: 2018-01-09 | DRG: 309 | Disposition: A | Discharge status: Home / Self Care | Payer: 59 | Source: Ambulatory Visit | Attending: Cardiology | Admitting: Cardiology

## 2018-01-06 ENCOUNTER — Other Ambulatory Visit: Payer: Self-pay

## 2018-01-06 DIAGNOSIS — I5020 Unspecified systolic (congestive) heart failure: Secondary | ICD-10-CM | POA: Diagnosis present

## 2018-01-06 DIAGNOSIS — Z880 Allergy status to penicillin: Secondary | ICD-10-CM

## 2018-01-06 DIAGNOSIS — I484 Atypical atrial flutter: Secondary | ICD-10-CM | POA: Diagnosis present

## 2018-01-06 DIAGNOSIS — I471 Supraventricular tachycardia: Secondary | ICD-10-CM | POA: Diagnosis present

## 2018-01-06 DIAGNOSIS — I481 Persistent atrial fibrillation: Secondary | ICD-10-CM | POA: Diagnosis present

## 2018-01-06 DIAGNOSIS — Z87891 Personal history of nicotine dependence: Secondary | ICD-10-CM

## 2018-01-06 DIAGNOSIS — I4891 Unspecified atrial fibrillation: Secondary | ICD-10-CM | POA: Diagnosis not present

## 2018-01-06 DIAGNOSIS — I452 Bifascicular block: Secondary | ICD-10-CM | POA: Diagnosis present

## 2018-01-06 DIAGNOSIS — Z7984 Long term (current) use of oral hypoglycemic drugs: Secondary | ICD-10-CM

## 2018-01-06 DIAGNOSIS — Z8249 Family history of ischemic heart disease and other diseases of the circulatory system: Secondary | ICD-10-CM

## 2018-01-06 DIAGNOSIS — E668 Other obesity: Secondary | ICD-10-CM | POA: Diagnosis not present

## 2018-01-06 DIAGNOSIS — Z9989 Dependence on other enabling machines and devices: Secondary | ICD-10-CM | POA: Diagnosis not present

## 2018-01-06 DIAGNOSIS — I4892 Unspecified atrial flutter: Secondary | ICD-10-CM | POA: Diagnosis not present

## 2018-01-06 DIAGNOSIS — E669 Obesity, unspecified: Secondary | ICD-10-CM

## 2018-01-06 DIAGNOSIS — E119 Type 2 diabetes mellitus without complications: Secondary | ICD-10-CM | POA: Diagnosis present

## 2018-01-06 DIAGNOSIS — G4733 Obstructive sleep apnea (adult) (pediatric): Secondary | ICD-10-CM

## 2018-01-06 DIAGNOSIS — Z6834 Body mass index (BMI) 34.0-34.9, adult: Secondary | ICD-10-CM | POA: Diagnosis not present

## 2018-01-06 DIAGNOSIS — Z79899 Other long term (current) drug therapy: Secondary | ICD-10-CM | POA: Diagnosis not present

## 2018-01-06 DIAGNOSIS — I110 Hypertensive heart disease with heart failure: Secondary | ICD-10-CM | POA: Diagnosis present

## 2018-01-06 DIAGNOSIS — I429 Cardiomyopathy, unspecified: Secondary | ICD-10-CM

## 2018-01-06 DIAGNOSIS — I10 Essential (primary) hypertension: Secondary | ICD-10-CM

## 2018-01-06 HISTORY — DX: Other persistent atrial fibrillation: I48.19

## 2018-01-06 LAB — GLUCOSE, CAPILLARY
GLUCOSE-CAPILLARY: 88 mg/dL (ref 65–99)
GLUCOSE-CAPILLARY: 118 mg/dL — AB (ref 65–99)

## 2018-01-06 LAB — MAGNESIUM
Magnesium: 1.6 mg/dL — ABNORMAL LOW (ref 1.7–2.4)
Magnesium: 2.2 mg/dL (ref 1.7–2.4)

## 2018-01-06 LAB — BASIC METABOLIC PANEL
Anion gap: 10 (ref 5–15)
BUN: 11 mg/dL (ref 6–20)
CALCIUM: 10 mg/dL (ref 8.9–10.3)
CHLORIDE: 101 mmol/L (ref 101–111)
CO2: 27 mmol/L (ref 22–32)
CREATININE: 0.71 mg/dL (ref 0.61–1.24)
GFR calc Af Amer: >60 mL/min (ref >60)
GFR calc non Af Amer: >60 mL/min (ref >60)
GLUCOSE: 110 mg/dL — AB (ref 65–99)
Potassium: 3.6 mmol/L (ref 3.5–5.1)
Sodium: 138 mmol/L (ref 135–145)

## 2018-01-06 LAB — CBC WITH DIFFERENTIAL/PLATELET
Abs Immature Granulocytes: 0 10*3/uL (ref 0.0–0.1)
BASOS PCT: 0 %
Basophils Absolute: 0 10*3/uL (ref 0.0–0.1)
EOS ABS: 0.2 10*3/uL (ref 0.0–0.7)
EOS PCT: 2 %
HEMATOCRIT: 39.2 % (ref 39.0–52.0)
Hemoglobin: 12.5 g/dL — ABNORMAL LOW (ref 13.0–17.0)
Immature Granulocytes: 0 %
LYMPHS ABS: 1.7 10*3/uL (ref 0.7–4.0)
Lymphocytes Relative: 21 %
MCH: 29.9 pg (ref 26.0–34.0)
MCHC: 31.9 g/dL (ref 30.0–36.0)
MCV: 93.8 fL (ref 78.0–100.0)
MONOS PCT: 8 %
Monocytes Absolute: 0.7 10*3/uL (ref 0.1–1.0)
Neutro Abs: 5.5 10*3/uL (ref 1.7–7.7)
Neutrophils Relative %: 69 %
PLATELETS: 186 10*3/uL (ref 150–400)
RBC: 4.18 MIL/uL — ABNORMAL LOW (ref 4.22–5.81)
RDW: 15.8 % — AB (ref 11.5–15.5)
WBC: 8 10*3/uL (ref 4.0–10.5)

## 2018-01-06 LAB — COMPREHENSIVE METABOLIC PANEL
ALK PHOS: 83 U/L (ref 38–126)
ALT: 13 U/L — AB (ref 17–63)
AST: 20 U/L (ref 15–41)
Albumin: 3.8 g/dL (ref 3.5–5.0)
Anion gap: 10 (ref 5–15)
BUN: 13 mg/dL (ref 6–20)
CHLORIDE: 104 mmol/L (ref 101–111)
CO2: 25 mmol/L (ref 22–32)
CREATININE: 0.7 mg/dL (ref 0.61–1.24)
Calcium: 9.6 mg/dL (ref 8.9–10.3)
Glucose, Bld: 97 mg/dL (ref 65–99)
Potassium: 3.6 mmol/L (ref 3.5–5.1)
Sodium: 139 mmol/L (ref 135–145)
Total Bilirubin: 1.4 mg/dL — ABNORMAL HIGH (ref 0.3–1.2)
Total Protein: 7 g/dL (ref 6.5–8.1)

## 2018-01-06 MED ORDER — DOFETILIDE 125 MCG PO CAPS: 125.0000 ug | ORAL_CAPSULE | Freq: Two times a day (BID) | ORAL | Status: DC

## 2018-01-06 MED ORDER — DILTIAZEM HCL ER COATED BEADS 120 MG PO CP24
120.0000 mg | ORAL_CAPSULE | Freq: Every day | ORAL | Status: DC
  Filled 2018-01-06 (×2): qty 1

## 2018-01-06 MED ORDER — ADULT MULTIVITAMIN W/MINERALS CH
1.0000 | ORAL_TABLET | Freq: Every day | ORAL | Status: DC
  Administered 2018-01-07 – 2018-01-08 (×2): 1 via ORAL
  Filled 2018-01-06 (×3): qty 1

## 2018-01-06 MED ORDER — RIVAROXABAN 20 MG PO TABS: 20.0000 mg | ORAL_TABLET | Freq: Every day | ORAL | Status: DC

## 2018-01-06 MED ORDER — INSULIN ASPART 100 UNIT/ML ~~LOC~~ SOLN
0.0000 [IU] | Freq: Three times a day (TID) | SUBCUTANEOUS | Status: DC
  Administered 2018-01-08 – 2018-01-09 (×2): 2 [IU] via SUBCUTANEOUS

## 2018-01-06 MED ORDER — ONDANSETRON HCL 4 MG/2ML IJ SOLN: 4.0000 mg | Freq: Four times a day (QID) | INTRAMUSCULAR | Status: DC | PRN

## 2018-01-06 MED ORDER — METOPROLOL TARTRATE 25 MG PO TABS
25.0000 mg | ORAL_TABLET | Freq: Two times a day (BID) | ORAL | Status: DC
  Administered 2018-01-06 – 2018-01-09 (×6): 25 mg via ORAL
  Filled 2018-01-06 (×6): qty 1

## 2018-01-06 MED ORDER — DOFETILIDE 125 MCG PO CAPS: 250.0000 ug | ORAL_CAPSULE | Freq: Two times a day (BID) | ORAL | Status: DC

## 2018-01-06 MED ORDER — SODIUM CHLORIDE 0.9% FLUSH
3.0000 mL | Freq: Two times a day (BID) | INTRAVENOUS | Status: DC
  Administered 2018-01-06 – 2018-01-08 (×4): 3 mL via INTRAVENOUS

## 2018-01-06 MED ORDER — FUROSEMIDE 20 MG PO TABS: 20.0000 mg | ORAL_TABLET | Freq: Every day | ORAL | Status: DC

## 2018-01-06 MED ORDER — FUROSEMIDE 10 MG/ML IJ SOLN
40.0000 mg | Freq: Two times a day (BID) | INTRAMUSCULAR | Status: DC
  Administered 2018-01-06 – 2018-01-08 (×5): 40 mg via INTRAVENOUS
  Filled 2018-01-06 (×5): qty 4

## 2018-01-06 MED ORDER — DOFETILIDE 125 MCG PO CAPS
250.0000 ug | ORAL_CAPSULE | Freq: Two times a day (BID) | ORAL | Status: DC
  Administered 2018-01-07: 250 ug via ORAL
  Filled 2018-01-06: qty 2

## 2018-01-06 MED ORDER — SODIUM CHLORIDE 0.9 % IV SOLN
250.0000 mL | INTRAVENOUS | Status: DC | PRN
  Administered 2018-01-08: 11:00:00 via INTRAVENOUS

## 2018-01-06 MED ORDER — ACETAMINOPHEN 325 MG PO TABS: 650.0000 mg | ORAL_TABLET | ORAL | Status: DC | PRN

## 2018-01-06 MED ORDER — POTASSIUM CHLORIDE CRYS ER 20 MEQ PO TBCR
40.0000 meq | EXTENDED_RELEASE_TABLET | Freq: Once | ORAL | Status: AC
  Administered 2018-01-06: 40 meq via ORAL
  Filled 2018-01-06: qty 2

## 2018-01-06 MED ORDER — RIVAROXABAN 20 MG PO TABS
20.0000 mg | ORAL_TABLET | Freq: Every day | ORAL | Status: DC
  Administered 2018-01-06 – 2018-01-08 (×3): 20 mg via ORAL
  Filled 2018-01-06 (×3): qty 1

## 2018-01-06 MED ORDER — MAGNESIUM SULFATE 4 GM/100ML IV SOLN
4.0000 g | Freq: Once | INTRAVENOUS | Status: AC
  Administered 2018-01-06: 4 g via INTRAVENOUS
  Filled 2018-01-06: qty 100

## 2018-01-06 MED ORDER — POTASSIUM CHLORIDE CRYS ER 20 MEQ PO TBCR
20.0000 meq | EXTENDED_RELEASE_TABLET | Freq: Two times a day (BID) | ORAL | Status: DC
  Filled 2018-01-06: qty 1

## 2018-01-06 MED ORDER — SODIUM CHLORIDE 0.9% FLUSH: 3.0000 mL | INTRAVENOUS | Status: DC | PRN

## 2018-01-06 MED ORDER — LOSARTAN POTASSIUM 50 MG PO TABS
100.0000 mg | ORAL_TABLET | Freq: Every day | ORAL | Status: DC
  Administered 2018-01-06 – 2018-01-08 (×3): 100 mg via ORAL
  Filled 2018-01-06 (×3): qty 2

## 2018-01-06 NOTE — Progress Notes (Signed)
Patient arrived the unit form home accompanied by his wife, assessment completed see flowsheet, placed on tele ccmd notified, MD notified of patient's arrival, patient oriented to room and staff, bed in lowest position call bell within reach will continue to monitor.

## 2018-01-06 NOTE — Progress Notes (Addendum)
Pharmacy Review for Dofetilide (Tikosyn) Initiation  Admit Complaint: 57 y.o. male admitted 01/06/2018 with atrial fibrillation to be initiated on dofetilide.   Assessment:  Patient Exclusion Criteria: If any screening criteria checked as "Yes", then  patient  should NOT receive dofetilide until criteria item is corrected. If "Yes" please indicate correction plan.  YES  NO Patient  Exclusion Criteria Correction Plan  []  [x]  Baseline QTc interval is greater than or equal to 440 msec. IF above YES box checked dofetilide contraindicated unless patient has ICD; then may proceed if QTc 500-550 msec or with known ventricular conduction abnormalities may proceed with QTc 550-600 msec. QTc =  470 (425 per MD note)   [x]  []  Magnesium level is less than 1.8 mEq/l : Last magnesium: No results found for: MG     MD ordered Mag sulfate 4g IV x 1, will f/u repeat  [x]  []  Potassium level is less than 4 mEq/l : Last potassium:  Lab Results  Component Value Date   K 3.6 12/03/2014       MD ordered Kdur PO x 1, will f/u repeat  []  [x]  Patient is known or suspected to have a digoxin level greater than 2 ng/ml: No results found for: DIGOXIN    []  [x]  Creatinine clearance less than 20 ml/min (calculated using Cockcroft-Gault, actual body weight and serum creatinine): CrCl cannot be calculated (Patient's most recent lab result is older than the maximum 21 days allowed.).    []  [x]  Patient has received drugs known to prolong the QT intervals within the last 48 hours (phenothiazines, tricyclics or tetracyclic antidepressants, erythromycin, H-1 antihistamines, cisapride, fluoroquinolones, azithromycin). Drugs not listed above may have an, as yet, undetected potential to prolong the QT interval, updated information on QT prolonging agents is available at this website:QT prolonging agents Paged MD to d/c new order for Zofran PRN  []  [x]  Patient received a dose of hydrochlorothiazide (Oretic) alone or in any  combination including triamterene (Dyazide, Maxzide) in the last 48 hours.   []  [x]  Patient received a medication known to increase dofetilide plasma concentrations prior to initial dofetilide dose:  . Trimethoprim (Primsol, Proloprim) in the last 36 hours . Verapamil (Calan, Verelan) in the last 36 hours or a sustained release dose in the last 72 hours . Megestrol (Megace) in the last 5 days  . Cimetidine (Tagamet) in the last 6 hours . Ketoconazole (Nizoral) in the last 24 hours . Itraconazole (Sporanox) in the last 48 hours  . Prochlorperazine (Compazine) in the last 36 hours    []  [x]  Patient is known to have a history of torsades de pointes; congenital or acquired long QT syndromes.   []  [x]  Patient has received a Class 1 antiarrhythmic with less than 2 half-lives since last dose. (Disopyramide, Quinidine, Procainamide, Lidocaine, Mexiletine, Flecainide, Propafenone)   []  [x]  Patient has received amiodarone therapy in the past 3 months or amiodarone level is greater than 0.3 ng/ml.    Patient has been appropriately anticoagulated with Xarelto.  Ordering provider was confirmed at [email or web address] if they are not listed on the [facility] Authorized Prescribers list.  Goal of Therapy: Follow renal function, electrolytes, potential drug interactions, and dose adjustment. Provide education and 1 week supply at discharge.  Plan:  []   Physician selected initial dose within range recommended for patients level of renal function - will monitor for response.  [x]   Physician selected initial dose outside of range recommended for patients level of renal function - will discuss if  the dose should be altered at this time.   Select One Calculated CrCl  Dose q12h  [x]  > 60 ml/min 500 mcg  []  40-60 ml/min 250 mcg  []  20-40 ml/min 125 mcg   2. Follow up QTc after the first 5 doses, renal function, electrolytes (K & Mg) daily x 3     days, dose adjustment, success of initiation and facilitate 1 week  discharge supply as     clinically indicated.  3. Initiate Tikosyn education video (Call [phone] and ask for Tikosyn Video # [phone]).  4. Place Enrollment Form on the chart for discharge supply of dofetilide.  Will f/u for repeat K and Mag in range before starting dose. Per discussion with MD, wants lower dose due to QTc to start.  Babs BertinHaley Baird, PharmD, BCPS Clinical Pharmacist 01/06/2018 5:37 PM   Addendum: Repeat Mg now 2.2 but K still 3.6; MD ordered K+ 40 MEq x1 to repeat x1 if K <4, recheck w/ am labs.  Will f/u. Vernard GamblesVeronda Owain Eckerman, PharmD, BCPS 01/06/2018 11:57 PM   Addendum: AM Mg 1.9, K 4.0; will verify Tikosyn for 8am administration. Vernard GamblesVeronda Allyah Heather, PharmD, BCPS 01/07/2018 6:21 AM

## 2018-01-06 NOTE — H&P (Signed)
Jose Cain is an 57 y.o. male.   Chief Complaint: Shortness of breath HPI:   The patient is a 57 year old male who presents for a Follow-up for Atrial fibrillation. [id number] Caucasian male with hypertension, controlled type II diabetes mellitus, moderate obesity, OSA on CPAP, with recurrent episodes of both atrial fibrillation and typical as well as atypical atrial flutter.  He underwent cardioversion on 12/12/2017 with conversion to sinus rhythm. However, He has had recurrent atrial flutter as well as atrial fibrillation on subsequent visits. EF nited to be 30% while in atrial flutter with RVR. We had a long discussion regarding options for medical therapy including antiarrhythmic therapy, as well as ablation. Given his risk factors for the above arrhythmias, as well as combination of both atrial fibrillation and atrial flutter, we have mutually agreed on pursuing antiarrhythmic therapy with decrease in followed by another cardioversion. Patient is being electively admitted for tikosyn initiation followed by cardioversion on 01/08/2018. Marland Kitchen.  Past Medical History:  Diagnosis Date  . Diabetes mellitus without complication (HCC)   . Hypertension   . Kidney stone   . Obesity     Past Surgical History:  Procedure Laterality Date  . CARDIOVERSION N/A 12/12/2017   Procedure: CARDIOVERSION;  Surgeon: Elder NegusPatwardhan, Aubrey Voong J, MD;  Location: [facility];  Service: Cardiovascular;  Laterality: N/A;  . TEE WITHOUT CARDIOVERSION N/A 12/12/2017   Procedure: TRANSESOPHAGEAL ECHOCARDIOGRAM (TEE);  Surgeon: Elder NegusPatwardhan, Fatina Sprankle J, MD;  [facility];  Service: Cardiovascular;  Laterality: N/A;    No family history on file. Social History:  reports that he has quit smoking. He has never used smokeless tobacco. He reports that he does not drink alcohol or use drugs.  Allergies:  Allergies  Allergen Reactions  . Penicillins Swelling    Has patient had a PCN reaction causing immediate rash,  facial/tongue/throat swelling, SOB or lightheadedness with hypotension: yes Has patient had a PCN reaction causing severe rash involving mucus membranes or skin necrosis: no Has patient had a PCN reaction that required hospitalization: no Has patient had a PCN reaction occurring within the last 10 years: no If all of the above answers are "NO", then may proceed with Cephalosporin use.     Medications Prior to Admission  Medication Sig Dispense Refill  . cetirizine (ZYRTEC) 10 MG tablet Take 10 mg by mouth daily.    Marland Kitchen. diltiazem (CARDIZEM SR) 120 MG 12 hr capsule Take 120 mg by mouth daily.    . Flaxseed, Linseed, (EQL FLAX SEED OIL) 1000 MG CAPS Take 2,000 mg by mouth 2 (two) times daily.    . furosemide (LASIX) 20 MG tablet Take 20 mg by mouth daily.   5  . ibuprofen (ADVIL,MOTRIN) 200 MG tablet Take 400 mg by mouth every 6 (six) hours as needed for moderate pain.    Marland Kitchen. losartan (COZAAR) 100 MG tablet Take 1 tablet by mouth daily.  0  . metFORMIN (GLUCOPHAGE) 1000 MG tablet Take 1,000 mg by mouth 2 (two) times daily with a meal.    . Multiple Vitamin (MULTIVITAMIN WITH MINERALS) TABS tablet Take 1 tablet by mouth daily.    . rivaroxaban (XARELTO) 20 MG TABS tablet Take 20 mg by mouth daily with supper.      EKG 01/06/2018: Atrial flutter with variable conduction. RBBB. LAFB QTc by Kendra OpitzFrederecia formula is 250 msec   Echocardiogram 12/29/2017: Mild concentric hypertrophy of the left ventricle. Severe decrease in  LV systolic function with severe global hypokinesis. Unable to evaluate diastolic function  due to A. Fibrillation with RVR. Visual EF is 30% Calculated EF 34%. Left atrial cavity is severely dilated at 5.4 cm. Mild tricuspid regurgitation. No evidence of pulmonary hypertension  Review of Systems  Constitutional: Positive for malaise/fatigue.  HENT: Negative.   Eyes: Negative.   Respiratory: Positive for shortness of breath.   Cardiovascular: Positive for leg swelling. Negative  for chest pain and palpitations.  Gastrointestinal: Negative for nausea and vomiting.  Skin: Negative.   Neurological: Negative.  Negative for loss of consciousness.  Endo/Heme/Allergies: Does not bruise/bleed easily.  Psychiatric/Behavioral: Negative.   All other systems reviewed and are negative.   There were no vitals taken for this visit. Physical Exam  Nursing note and vitals reviewed. Constitutional: He is oriented to person, place, and time. He appears well-developed and well-nourished.  HENT:  Head: Normocephalic and atraumatic.  Eyes: Pupils are equal, round, and reactive to light. Conjunctivae are normal.  Neck: Normal range of motion. Neck supple. No JVD present.  Cardiovascular:  Variable S1, tachycardia, II/VI apical holosystolic murmur  Respiratory: Effort normal and breath sounds normal. He has no wheezes. He has no rales.  GI: Soft. Bowel sounds are normal. There is no rebound.  Musculoskeletal: He exhibits edema (2+ b/l).  Lymphadenopathy:    He has no cervical adenopathy.  Neurological: He is alert and oriented to person, place, and time.  Skin: Skin is warm and dry.  Psychiatric: He has a normal mood and affect.     Assessment: 57 y/o Caucasian male  Recurrent atrial fibrillation/atrial flutter: CHA2DS2VASc score 5, annual stroke risk 5% HFrEF: Likely Arrhythmia induced Cardiomyopathy Moderate obesity Type 2 DM OSA on CPAP  Plan: Admit to telemetry EKG with serial QTc monitoring Tikosyn 250 mg bid Keep K at 4, Mg at 2 Continue home medications diltiazem 120mg  daily, metoprolol 25 mg bid, Xarelto 20 mg daily, losartan 100 mg daily. Hold metformin. Sliding scale insulin. IV lasix 40 mg bid CPAP at night.  Elder Negus, MD 01/06/2018, 3:32 PM  Jeanise Durfey Emiliano Dyer, MD [facility] Pager: [phone] Office: [phone] If no answer Cell [phone]

## 2018-01-06 NOTE — Progress Notes (Signed)
QTc 425 msec by Frederecia formula  Elder NegusManish J Sheri Prows, MD [facility] Pager: [phone] Office: [phone] If no answer Cell [phone]

## 2018-01-07 LAB — BASIC METABOLIC PANEL
Anion gap: 9 (ref 5–15)
BUN: 13 mg/dL (ref 6–20)
CHLORIDE: 103 mmol/L (ref 101–111)
CO2: 27 mmol/L (ref 22–32)
CREATININE: 0.68 mg/dL (ref 0.61–1.24)
Calcium: 9.5 mg/dL (ref 8.9–10.3)
GFR calc non Af Amer: >60 mL/min (ref >60)
Glucose, Bld: 110 mg/dL — ABNORMAL HIGH (ref 65–99)
POTASSIUM: 4 mmol/L (ref 3.5–5.1)
Sodium: 139 mmol/L (ref 135–145)

## 2018-01-07 LAB — GLUCOSE, CAPILLARY
GLUCOSE-CAPILLARY: 127 mg/dL — AB (ref 65–99)
GLUCOSE-CAPILLARY: 85 mg/dL (ref 65–99)
Glucose-Capillary: 121 mg/dL — ABNORMAL HIGH (ref 65–99)
Glucose-Capillary: 115 mg/dL — ABNORMAL HIGH (ref 65–99)

## 2018-01-07 LAB — HIV ANTIBODY (ROUTINE TESTING W REFLEX): HIV Screen 4th Generation wRfx: NONREACTIVE

## 2018-01-07 LAB — MAGNESIUM: Magnesium: 1.9 mg/dL (ref 1.7–2.4)

## 2018-01-07 MED ORDER — CETIRIZINE HCL 10 MG PO TABS
10.0000 mg | ORAL_TABLET | Freq: Every day | ORAL | Status: DC
  Administered 2018-01-07 – 2018-01-08 (×2): 10 mg via ORAL
  Filled 2018-01-07 (×3): qty 1

## 2018-01-07 MED ORDER — POTASSIUM CHLORIDE CRYS ER 20 MEQ PO TBCR
40.0000 meq | EXTENDED_RELEASE_TABLET | Freq: Two times a day (BID) | ORAL | Status: DC
  Administered 2018-01-07 – 2018-01-09 (×5): 40 meq via ORAL
  Filled 2018-01-07 (×5): qty 2

## 2018-01-07 MED ORDER — MAGNESIUM SULFATE 4 GM/100ML IV SOLN
4.0000 g | Freq: Once | INTRAVENOUS | Status: AC
  Administered 2018-01-07: 4 g via INTRAVENOUS
  Filled 2018-01-07: qty 100

## 2018-01-07 MED ORDER — NON FORMULARY: 10.0000 mg | Freq: Every day | Status: DC

## 2018-01-07 MED ORDER — DOFETILIDE 500 MCG PO CAPS
500.0000 ug | ORAL_CAPSULE | Freq: Two times a day (BID) | ORAL | Status: DC
  Administered 2018-01-07: 500 ug via ORAL
  Filled 2018-01-07 (×2): qty 1

## 2018-01-07 NOTE — Progress Notes (Signed)
Subjective:  Feels well. Breathing stable. Leg edema improving  Objective:  Vital Signs in the last 24 hours: Temp:  [97.8 F (36.6 C)-98.3 F (36.8 C)] 97.8 F (36.6 C) (06/02 0720) Pulse Rate:  [63-98] 98 (06/02 0439) Resp:  [16-20] 19 (06/02 1056) BP: (93-129)/(63-106) 93/72 (06/02 1056) SpO2:  [97 %-98 %] 98 % (06/02 0720) Weight:  [118.3 kg (260 lb 11.2 oz)-121.5 kg (267 lb 14.4 oz)] 118.3 kg (260 lb 11.2 oz) (06/02 0616)  Intake/Output from previous day: 06/01 0701 - 06/02 0700 In: 940 [P.O.:840; IV Piggyback:100] Out: 2980 [Urine:2980] Intake/Output from this shift: Total I/O In: 340 [P.O.:240; IV Piggyback:100] Out: 800 [Urine:800]  Physical Exam: Nursing note and vitals reviewed. Constitutional: He is oriented to person, place, and time. He appears well-developed and well-nourished.  HENT:  Head: Normocephalic and atraumatic.  Eyes: Pupils are equal, round, and reactive to light. Conjunctivae are normal.  Neck: Normal range of motion. Neck supple. No JVD present.  Cardiovascular:  Variable S1, tachycardia, II/VI apical holosystolic murmur  Respiratory: Effort normal and breath sounds normal. He has no wheezes. He has no rales.  GI: Soft. Bowel sounds are normal. There is no rebound.  Musculoskeletal: He exhibits edema (2+ b/l).  Lymphadenopathy:    He has no cervical adenopathy.  Neurological: He is alert and oriented to person, place, and time.  Skin: Skin is warm and dry.  Psychiatric: He has a normal mood and affect.     Lab Results: Recent Labs    01/06/18 1546  WBC 8.0  HGB 12.5*  PLT 186   Recent Labs    01/06/18 2220 01/07/18 0323  NA 138 139  K 3.6 4.0  CL 101 103  CO2 27 27  GLUCOSE 110* 110*  BUN 11 13  CREATININE 0.71 0.68   No results for input(s): TROPONINI in the last 72 hours.  Invalid input(s): CK, MB Hepatic Function Panel Recent Labs    01/06/18 1546  PROT 7.0  ALBUMIN 3.8  AST 20  ALT 13*  ALKPHOS 83  BILITOT 1.4*     Cardiac Studies: EKG 01/06/2018: Atrial flutter with variable conduction. RBBB. LAFB QTc by Kendra OpitzFrederecia formula is 250 msec   Echocardiogram 12/29/2017: Mild concentric hypertrophy of the left ventricle. Severe decrease in LV systolic function with severe global hypokinesis. Unable to evaluate diastolic function due to A. Fibrillation with RVR. Visual EF is 30% Calculated EF 34%. Left atrial cavity is severely dilated at 5.4 cm. Mild tricuspid regurgitation. No evidence of pulmonary hypertension   Assessment: 57 y/o Caucasian male  Recurrent atrial fibrillation/atrial flutter: CHA2DS2VASc score 5, annual stroke risk 5% HFrEF: Likely Arrhythmia induced Cardiomyopathy Moderate obesity Type 2 DM OSA on CPAP  Plan: EKG with serial QTc monitoring Increase tikosyn to 500 mg bid Keep K at 4, Mg at 2 Continue metoprolol 25 mg bid, Xarelto 20 mg daily, losartan 100 mg daily. Discontinue diltiazem Hold metformin. Sliding scale insulin. IV lasix 40 mg bid CPAP at night. Plan of performing cardioversion on 06/02 morning.    LOS: 1 day    Kaleb Sek J Muranda Coye 01/07/2018, 11:25 AM   Teah Votaw Emiliano DyerJ Tadarrius Burch, MD [facility] Pager: [phone] Office: [phone] If no answer Cell [phone]

## 2018-01-07 NOTE — H&P (View-Only) (Signed)
Subjective:  Feels well. Breathing stable. Leg edema improving  Objective:  Vital Signs in the last 24 hours: Temp:  [97.8 F (36.6 C)-98.3 F (36.8 C)] 97.8 F (36.6 C) (06/02 0720) Pulse Rate:  [63-98] 98 (06/02 0439) Resp:  [16-20] 19 (06/02 1056) BP: (93-129)/(63-106) 93/72 (06/02 1056) SpO2:  [97 %-98 %] 98 % (06/02 0720) Weight:  [118.3 kg (260 lb 11.2 oz)-121.5 kg (267 lb 14.4 oz)] 118.3 kg (260 lb 11.2 oz) (06/02 0616)  Intake/Output from previous day: 06/01 0701 - 06/02 0700 In: 940 [P.O.:840; IV Piggyback:100] Out: 2980 [Urine:2980] Intake/Output from this shift: Total I/O In: 340 [P.O.:240; IV Piggyback:100] Out: 800 [Urine:800]  Physical Exam: Nursing note and vitals reviewed. Constitutional: He is oriented to person, place, and time. He appears well-developed and well-nourished.  HENT:  Head: Normocephalic and atraumatic.  Eyes: Pupils are equal, round, and reactive to light. Conjunctivae are normal.  Neck: Normal range of motion. Neck supple. No JVD present.  Cardiovascular:  Variable S1, tachycardia, II/VI apical holosystolic murmur  Respiratory: Effort normal and breath sounds normal. He has no wheezes. He has no rales.  GI: Soft. Bowel sounds are normal. There is no rebound.  Musculoskeletal: He exhibits edema (2+ b/l).  Lymphadenopathy:    He has no cervical adenopathy.  Neurological: He is alert and oriented to person, place, and time.  Skin: Skin is warm and dry.  Psychiatric: He has a normal mood and affect.     Lab Results: Recent Labs    01/06/18 1546  WBC 8.0  HGB 12.5*  PLT 186   Recent Labs    01/06/18 2220 01/07/18 0323  NA 138 139  K 3.6 4.0  CL 101 103  CO2 27 27  GLUCOSE 110* 110*  BUN 11 13  CREATININE 0.71 0.68   No results for input(s): TROPONINI in the last 72 hours.  Invalid input(s): CK, MB Hepatic Function Panel Recent Labs    01/06/18 1546  PROT 7.0  ALBUMIN 3.8  AST 20  ALT 13*  ALKPHOS 83  BILITOT 1.4*     Cardiac Studies: EKG 01/06/2018: Atrial flutter with variable conduction. RBBB. LAFB QTc by Frederecia formula is 250 msec   Echocardiogram 12/29/2017: Mild concentric hypertrophy of the left ventricle. Severe decrease in LV systolic function with severe global hypokinesis. Unable to evaluate diastolic function due to A. Fibrillation with RVR. Visual EF is 30% Calculated EF 34%. Left atrial cavity is severely dilated at 5.4 cm. Mild tricuspid regurgitation. No evidence of pulmonary hypertension   Assessment: 56 y/o Caucasian male  Recurrent atrial fibrillation/atrial flutter: CHA2DS2VASc score 5, annual stroke risk 5% HFrEF: Likely Arrhythmia induced Cardiomyopathy Moderate obesity Type 2 DM OSA on CPAP  Plan: EKG with serial QTc monitoring Increase tikosyn to 500 mg bid Keep K at 4, Mg at 2 Continue metoprolol 25 mg bid, Xarelto 20 mg daily, losartan 100 mg daily. Discontinue diltiazem Hold metformin. Sliding scale insulin. IV lasix 40 mg bid CPAP at night. Plan of performing cardioversion on 06/02 morning.    LOS: 1 day    Nimisha Rathel J Farhiya Rosten 01/07/2018, 11:25 AM   Tariyah Pendry J Kynzi Levay, MD [facility] Pager: [phone] Office: [phone] If no answer Cell [phone]    

## 2018-01-07 NOTE — Progress Notes (Signed)
Patient watched Tikosyn video.

## 2018-01-08 ENCOUNTER — Encounter (HOSPITAL_COMMUNITY): Payer: Self-pay | Admitting: *Deleted

## 2018-01-08 ENCOUNTER — Encounter (HOSPITAL_COMMUNITY)
Admission: AD | Disposition: A | Discharge status: Home / Self Care | Payer: Self-pay | Source: Ambulatory Visit | Attending: Cardiology

## 2018-01-08 ENCOUNTER — Inpatient Hospital Stay (HOSPITAL_COMMUNITY): Payer: 59 | Admitting: Anesthesiology

## 2018-01-08 HISTORY — PX: CARDIOVERSION: SHX1299

## 2018-01-08 LAB — BASIC METABOLIC PANEL
ANION GAP: 9 (ref 5–15)
BUN: 16 mg/dL (ref 6–20)
CALCIUM: 9.2 mg/dL (ref 8.9–10.3)
CO2: 26 mmol/L (ref 22–32)
CREATININE: 0.71 mg/dL (ref 0.61–1.24)
Chloride: 102 mmol/L (ref 101–111)
GFR calc Af Amer: >60 mL/min (ref >60)
GLUCOSE: 147 mg/dL — AB (ref 65–99)
Potassium: 4 mmol/L (ref 3.5–5.1)
Sodium: 137 mmol/L (ref 135–145)

## 2018-01-08 LAB — GLUCOSE, CAPILLARY
GLUCOSE-CAPILLARY: 101 mg/dL — AB (ref 65–99)
GLUCOSE-CAPILLARY: 118 mg/dL — AB (ref 65–99)
GLUCOSE-CAPILLARY: 121 mg/dL — AB (ref 65–99)
Glucose-Capillary: 137 mg/dL — ABNORMAL HIGH (ref 65–99)

## 2018-01-08 LAB — MAGNESIUM: Magnesium: 2 mg/dL (ref 1.7–2.4)

## 2018-01-08 SURGERY — CARDIOVERSION
Anesthesia: General

## 2018-01-08 MED ORDER — FUROSEMIDE 40 MG PO TABS
40.0000 mg | ORAL_TABLET | Freq: Two times a day (BID) | ORAL | Status: DC
  Administered 2018-01-08: 40 mg via ORAL
  Filled 2018-01-08 (×2): qty 1

## 2018-01-08 MED ORDER — LIDOCAINE HCL (CARDIAC) PF 100 MG/5ML IV SOSY
PREFILLED_SYRINGE | INTRAVENOUS | Status: DC | PRN
Start: 2018-01-08 — End: 2018-01-08
  Administered 2018-01-08 (×2): 100 mg via INTRAVENOUS

## 2018-01-08 MED ORDER — PROPOFOL 10 MG/ML IV BOLUS
INTRAVENOUS | Status: DC | PRN
  Administered 2018-01-08: 100 mg via INTRAVENOUS

## 2018-01-08 NOTE — Progress Notes (Signed)
Endo RN called regarding patient, orders received in epic to perform EKG at this time. This was done and placed on chart. Endo called back and made aware that EKG done and result stated wide QRS complex. RN in Endo stated she would let Dr. Preston FleetingPatwadhan know it was completed. Will monitor patient. Jannis Atkins, Randall AnKristin Jessup RN

## 2018-01-08 NOTE — Anesthesia Procedure Notes (Signed)
Procedure Name: General with mask airway Date/Time: 01/08/2018 11:10 AM Performed by: Lovie Cholock, Reyden Smith K, CRNA Pre-anesthesia Checklist: Patient identified, Emergency Drugs available, Suction available and Patient being monitored Patient Re-evaluated:Patient Re-evaluated prior to induction

## 2018-01-08 NOTE — Anesthesia Preprocedure Evaluation (Addendum)
Anesthesia Evaluation  Patient identified by MRN, date of birth, ID band Patient awake    Reviewed: Allergy & Precautions, NPO status , Patient's Chart, lab work & pertinent test results, reviewed documented beta blocker date and time   Airway Mallampati: II  TM Distance: >3 FB Neck ROM: Full    Dental   Pulmonary sleep apnea and Continuous Positive Airway Pressure Ventilation , former smoker,    Pulmonary exam normal        Cardiovascular hypertension, Pt. on medications and Pt. on home beta blockers Normal cardiovascular exam+ dysrhythmias Atrial Fibrillation      Neuro/Psych    GI/Hepatic   Endo/Other  diabetes, Type 2, Oral Hypoglycemic Agents  Renal/GU      Musculoskeletal   Abdominal   Peds  Hematology   Anesthesia Other Findings   Reproductive/Obstetrics                            Anesthesia Physical Anesthesia Plan  ASA: III  Anesthesia Plan: General   Post-op Pain Management:    Induction: Intravenous  PONV Risk Score and Plan: 2 and Treatment may vary due to age or medical condition  Airway Management Planned: Mask  Additional Equipment:   Intra-op Plan:   Post-operative Plan:   Informed Consent: I have reviewed the patients History and Physical, chart, labs and discussed the procedure including the risks, benefits and alternatives for the proposed anesthesia with the patient or authorized representative who has indicated his/her understanding and acceptance.     Plan Discussed with: CRNA and Surgeon  Anesthesia Plan Comments:         Anesthesia Quick Evaluation

## 2018-01-08 NOTE — Transfer of Care (Signed)
Immediate Anesthesia Transfer of Care Note  Patient: Jose GreenerGregory Cain  Procedure(s) Performed: CARDIOVERSION (N/A )  Patient Location: Endoscopy Unit  Anesthesia Type:General  Level of Consciousness: awake, oriented and patient cooperative  Airway & Oxygen Therapy: Patient Spontanous Breathing and Patient connected to nasal cannula oxygen  Post-op Assessment: Report given to RN and Post -op Vital signs reviewed and stable  Post vital signs: Reviewed  Last Vitals:  Vitals Value Taken Time  BP 110/63 01/08/2018 11:34 AM  Temp 36.6 C 01/08/2018 11:34 AM  Pulse 91 01/08/2018 11:35 AM  Resp 18 01/08/2018 11:35 AM  SpO2 98 % 01/08/2018 11:35 AM  Vitals shown include unvalidated device data.  Last Pain:  Vitals:   01/08/18 1134  TempSrc: Oral  PainSc: 0-No pain         Complications: No apparent anesthesia complications

## 2018-01-08 NOTE — Progress Notes (Signed)
Patient arrived back from Endo vital signs obtained and patient on monitor. Will monitor patient. Gresham Caetano, Randall AnKristin Jessup RN

## 2018-01-08 NOTE — Progress Notes (Signed)
Patient has home CPAP within reach at bedside. RT assistance not needed at this time. 

## 2018-01-08 NOTE — Progress Notes (Signed)
Subjective:  Feels well. Breathing stable. Leg edema improving  Objective:  Vital Signs in the last 24 hours: Temp:  [97.8 F (36.6 C)-98.6 F (37 C)] 97.9 F (36.6 C) (06/03 1134) Pulse Rate:  [84-106] 94 (06/03 1145) Resp:  [12-22] 21 (06/03 1225) BP: (102-129)/(63-85) 108/74 (06/03 1225) SpO2:  [96 %-99 %] 99 % (06/03 1225) Weight:  [116.5 kg (256 lb 14.2 oz)] 116.5 kg (256 lb 14.2 oz) (06/03 0606)  Intake/Output from previous day: 06/02 0701 - 06/03 0700 In: 940 [P.O.:840; IV Piggyback:100] Out: 2980 [Urine:2980] Intake/Output from this shift: Total I/O In: -  Out: 550 [Urine:550]  Physical Exam: Nursing note and vitals reviewed. Constitutional: He is oriented to person, place, and time. He appears well-developed and well-nourished.  HENT:  Head: Normocephalic and atraumatic.  Eyes: Pupils are equal, round, and reactive to light. Conjunctivae are normal.  Neck: Normal range of motion. Neck supple. No JVD present.  Cardiovascular:  Variable S1, tachycardia, II/VI apical holosystolic murmur  Respiratory: Effort normal and breath sounds normal. He has no wheezes. He has no rales.  GI: Soft. Bowel sounds are normal. There is no rebound.  Musculoskeletal: He exhibits edema (1+ b/l).  Lymphadenopathy:    He has no cervical adenopathy.  Neurological: He is alert and oriented to person, place, and time.  Skin: Skin is warm and dry.  Psychiatric: He has a normal mood and affect.     Lab Results: Recent Labs    01/06/18 1546  WBC 8.0  HGB 12.5*  PLT 186   Recent Labs    01/07/18 0323 01/08/18 0317  NA 139 137  K 4.0 4.0  CL 103 102  CO2 27 26  GLUCOSE 110* 147*  BUN 13 16  CREATININE 0.68 0.71   No results for input(s): TROPONINI in the last 72 hours.  Invalid input(s): CK, MB Hepatic Function Panel Recent Labs    01/06/18 1546  PROT 7.0  ALBUMIN 3.8  AST 20  ALT 13*  ALKPHOS 83  BILITOT 1.4*    Cardiac Studies: EKG 01/06/2018: Atrial flutter  with variable conduction. RBBB. LAFB QTc by Kendra OpitzFrederecia formula is 250 msec   Echocardiogram 12/29/2017: Mild concentric hypertrophy of the left ventricle. Severe decrease in LV systolic function with severe global hypokinesis. Unable to evaluate diastolic function due to A. Fibrillation with RVR. Visual EF is 30% Calculated EF 34%. Left atrial cavity is severely dilated at 5.4 cm. Mild tricuspid regurgitation. No evidence of pulmonary hypertension   Assessment: 57 y/o Caucasian male  Recurrent atrial fibrillation/atrial flutter: CHA2DS2VASc score 5, annual stroke risk 5%. Unsuccessful cardioversion 06/03 Patient has continued to have atypical flutter with 2:1 AV conduction. QTc prolonged (accurate calculation limited due to flutter waves buried in QRS complex) necessitating discontinuation of tikosyn. HFrEF: Likely Arrhythmia induced Cardiomyopathy Moderate obesity Type 2 DM OSA on CPAP  Plan: Stop Joice Loftskosyn Continue metoprolol 25 mg bid, Xarelto 20 mg daily, losartan 100 mg daily. Switch lasix iv 40 mg bid to PO 40 mg bid with additional as needed IV lasix. Keep K at 4, Mg at 2 Hold metformin. Sliding scale insulin. IV lasix 40 mg bid CPAP at night.  EP consulted given recurrent Afibflutter   LOS: 2 days    Leinaala Catanese J Zeyad Delaguila 01/08/2018, 9:12 PM   Melburn Treiber Emiliano DyerJ Coen Miyasato, MD [facility] Pager: [phone] Office: [phone] If no answer Cell [phone]

## 2018-01-08 NOTE — Consult Note (Addendum)
ELECTROPHYSIOLOGY CONSULT NOTE    Patient ID: Jose Cain MRN: [id number], DOB/AGE: 56/12/1960 56 y.o.  Admit date: 01/06/2018 Date of Consult: 01/08/2018  Primary Physician: Gwenlyn FoundEksir, Samantha A, MD Primary Cardiologist: Patwhardan Electrophysiologist: Ladona Ridgelaylor (new this admission)  Patient Profile: Jose Cain is a 57 y.o. male with a history of OSA, HTN, diabetes who is being seen today for the evaluation of AF and atypical atrial flutter at the request of Dr Rosemary HolmsPatwardhan.  HPI:  Jose Cain is a 57 y.o. male with the above past medical history. In December of 2018, he noticed that he was gaining a significant amount of weight around his mid section. This was also associated with increased shortness of breath with exertion and orthopnea. These symptoms persisted until April of this year when he was seen by his PCP who started Lasix.  He was also referred to cardiology at that time and was found to be in atrial fibrillation. He was started on Xarelto and underwent TEE guided cardioversion.  He was in SR for a few days and noticed less shortness of breath with exertion and orthopnea while in SR. He then reverted back to atrial fibrillation. On follow up, he was offered Tikosyn to try to help maintain SR and was admitted 01/06/18 for Tikosyn load.  He underwent cardioversion today with persistent (likely) atypical flutter. EP has been asked to evaluate for treatment options.  He has been intentionally losing weight as well as compliant with CPAP. He stopped smoking last year and tries to walk regularly with his wife.   TEE 12/2017 demonstrated normal LVEF, mild to moderate MR, moderate LA enlargement.   Today, he feels "ok".  He has persistent orthopnea which is mild by his report. No chest pain, shortness of breath at rest, nausea, vomiting, dizziness, syncope, edema, weight gain, or early satiety.  Past Medical History:  Diagnosis Date  . Diabetes mellitus without complication (HCC)   .  Hypertension   . Kidney stone   . Obesity   . Persistent atrial fibrillation [facility](HCC)      Surgical History:  Past Surgical History:  Procedure Laterality Date  . CARDIOVERSION N/A 12/12/2017   Procedure: CARDIOVERSION;  Surgeon: Elder NegusPatwardhan, Manish J, MD;  Location: [facility] ENDOSCOPY;  Service: Cardiovascular;  Laterality: N/A;  . TEE WITHOUT CARDIOVERSION N/A 12/12/2017   Procedure: TRANSESOPHAGEAL ECHOCARDIOGRAM (TEE);  Surgeon: Elder NegusPatwardhan, Manish J, MD;  [facility];  Service: Cardiovascular;  Laterality: N/A;     Medications Prior to Admission  Medication Sig Dispense Refill Last Dose  . cetirizine (ZYRTEC) 10 MG tablet Take 10 mg by mouth daily.   01/06/2018 at am  . diltiazem (DILT-XR) 120 MG 24 hr capsule Take 120 mg by mouth daily.   01/06/2018 at am  . Flaxseed, Linseed, (FLAXSEED OIL) 1000 MG CAPS Take 2,000 mg by mouth 2 (two) times daily.   01/06/2018 at am  . furosemide (LASIX) 20 MG tablet Take 20 mg by mouth daily.   5 01/06/2018 at am  . ibuprofen (ADVIL,MOTRIN) 200 MG tablet Take 400 mg by mouth every 6 (six) hours as needed for moderate pain.   few months ago  . losartan (COZAAR) 100 MG tablet Take 100 mg by mouth at bedtime.   0 01/05/2018 at pm  . metFORMIN (GLUCOPHAGE) 1000 MG tablet Take 1,000 mg by mouth 2 (two) times daily with a meal.   01/06/2018 at am  . metoprolol tartrate (LOPRESSOR) 25 MG tablet Take 25 mg by mouth 2 (two) times daily.  2 01/06/2018 at 730  . Multiple Vitamin (MULTIVITAMIN WITH MINERALS) TABS tablet Take 1 tablet by mouth daily.   01/06/2018 at am  . rivaroxaban (XARELTO) 20 MG TABS tablet Take 20 mg by mouth at bedtime.    01/05/2018 at 2300    Inpatient Medications:  . cetirizine  10 mg Oral Daily  . furosemide  40 mg Intravenous Q12H  . insulin aspart  0-15 Units Subcutaneous TID WC  . losartan  100 mg Oral QHS  . metoprolol tartrate  25 mg Oral BID  . multivitamin with minerals  1 tablet Oral Daily  . potassium chloride  40 mEq Oral BID  .  rivaroxaban  20 mg Oral Q supper  . sodium chloride flush  3 mL Intravenous Q12H    Allergies:  Allergies  Allergen Reactions  . Penicillins Swelling    Has patient had a PCN reaction causing immediate rash, facial/tongue/throat swelling, SOB or lightheadedness with hypotension: yes Has patient had a PCN reaction causing severe rash involving mucus membranes or skin necrosis: no Has patient had a PCN reaction that required hospitalization: no Has patient had a PCN reaction occurring within the last 10 years: no If all of the above answers are "NO", then may proceed with Cephalosporin use.     Social History   Socioeconomic History  . Marital status: Unknown    Spouse name: Not on file  . Number of children: Not on file  . Years of education: Not on file  . Highest education level: Not on file  Occupational History  . Not on file  Social Needs  . Financial resource strain: Not on file  . Food insecurity:    Worry: Not on file    Inability: Not on file  . Transportation needs:    Medical: Not on file    Non-medical: Not on file  Tobacco Use  . Smoking status: Former Games developer  . Smokeless tobacco: Never Used  Substance and Sexual Activity  . Alcohol use: No  . Drug use: No  . Sexual activity: Not on file  Lifestyle  . Physical activity:    Days per week: Not on file    Minutes per session: Not on file  . Stress: Not on file  Relationships  . Social connections:    Talks on phone: Not on file    Gets together: Not on file    Attends religious service: Not on file    Active member of club or organization: Not on file    Attends meetings of clubs or organizations: Not on file    Relationship status: Not on file  . Intimate partner violence:    Fear of current or ex partner: Not on file    Emotionally abused: Not on file    Physically abused: Not on file    Forced sexual activity: Not on file  Other Topics Concern  . Not on file  Social History Narrative  . Not on  file     Family History  Problem Relation Age of Onset  . Coronary artery disease Mother      Review of Systems: All other systems reviewed and are otherwise negative except as noted above.  Physical Exam: Vitals:   01/08/18 1134 01/08/18 1140 01/08/18 1145 01/08/18 1225  BP: 110/63 102/68 103/73 108/74  Pulse: 92 92 94   Resp: 18 17 18  (!) 21  Temp: 97.9 F (36.6 C)     TempSrc: Oral     SpO2:  97% 97% 96% 99%  Weight:      Height:        GEN- The patient is well appearing, alert and oriented x 3 today.   HEENT: normocephalic, atraumatic; sclera clear, conjunctiva pink; hearing intact; oropharynx clear; neck supple Lungs- Clear to ausculation bilaterally, normal work of breathing.  No wheezes, rales, rhonchi Heart- Regular rate and rhythm  GI- soft, non-tender, non-distended, bowel sounds present Extremities- no clubbing, cyanosis, or edema  MS- no significant deformity or atrophy Skin- warm and dry, no rash or lesion Psych- euthymic mood, full affect Neuro- strength and sensation are intact  Labs:   Lab Results  Component Value Date   WBC 8.0 01/06/2018   HGB 12.5 (L) 01/06/2018   HCT 39.2 01/06/2018   MCV 93.8 01/06/2018   PLT 186 01/06/2018    Recent Labs  Lab 01/06/18 1546  01/08/18 0317  NA 139   < > 137  K 3.6   < > 4.0  CL 104   < > 102  CO2 25   < > 26  BUN 13   < > 16  CREATININE 0.70   < > 0.71  CALCIUM 9.6   < > 9.2  PROT 7.0  --   --   BILITOT 1.4*  --   --   ALKPHOS 83  --   --   ALT 13*  --   --   AST 20  --   --   GLUCOSE 97   < > 147*   < > = values in this interval not displayed.      Radiology/Studies: No results found.  ZOX:WRUEAV atypical flutter (personally reviewed)  TELEMETRY: likely atypical flutter  (personally reviewed)  Assessment/Plan: 1.  Persistent atrial fibrillation/atypical flutter Likely present since at least December with symptoms He has failed Tikosyn  LA described as moderately enlarged by TEE with mild  to moderate MR I think he is a reasonable candidate for consideration of ablation - ideally would find an AAD that was effective prior to procedure He is doing all of the right things with lifestyle modification and I have encouraged him to continue with CPAP compliance, regular exercise, weight loss.   Continue Xarelto long term for CHADS2VASC of 2 There is a chance his EKG is SR with a long 1st degree AV block, but in lead III and V3, I think it is more likely atypical flutter.  Dr Ladona Ridgel to see later today     Signed, Gypsy Balsam, NP 01/08/2018 2:58 PM  I have seen, examined the patient, and reviewed the above assessment and plan.  Changes to above are made where necessary.  On exam, RRR.  The patient has had difficulty with diastolic dysfunction in the setting of afib/ atrial flutter.  He also at times appears to have an ectopic atrial tachycardia.  He has failed medical therapy with tikosyn.  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  Therapeutic strategies for afib including medicine and ablation were discussed in detail with the patient today. Risk, benefits, and alternatives to EP study and radiofrequency ablation for afib were also discussed in detail today. These risks include but are not limited to stroke, bleeding, vascular damage, tamponade, perforation, damage to the esophagus, lungs, and other structures, pulmonary vein stenosis, worsening renal function, and death. The patient understands these risk and wishes to proceed.  We will therefore proceed with catheter ablation at the next  available time.  Carto, ICE, anesthesia are requested for the procedure.  Will also obtain cardiac CT prior to the procedure to exclude LAA thrombus and further evaluate atrial anatomy.   Co Sign: [name], MD 01/09/2018 11:45 AM

## 2018-01-08 NOTE — Anesthesia Postprocedure Evaluation (Signed)
Anesthesia Post Note  Patient: Jose GreenerGregory Cain  Procedure(s) Performed: CARDIOVERSION (N/A )     Patient location during evaluation: PACU Anesthesia Type: General Level of consciousness: awake and alert Pain management: pain level controlled Vital Signs Assessment: post-procedure vital signs reviewed and stable Respiratory status: spontaneous breathing, nonlabored ventilation, respiratory function stable and patient connected to nasal cannula oxygen Cardiovascular status: blood pressure returned to baseline and stable Postop Assessment: no apparent nausea or vomiting Anesthetic complications: no    Last Vitals:  Vitals:   01/08/18 1145 01/08/18 1225  BP: 103/73 108/74  Pulse: 94   Resp: 18 (!) 21  Temp:    SpO2: 96% 99%    Last Pain:  Vitals:   01/08/18 1145  TempSrc:   PainSc: 0-No pain                 [name] DAVID

## 2018-01-08 NOTE — Progress Notes (Signed)
Patient with QTC on last EKG of 556. Dr. Rosemary HolmsPatwardhan made aware and orders received to hold Tikosyn this AM. Will monitor patient. Akshara Blumenthal, Randall AnKristin Jessup RN

## 2018-01-08 NOTE — Progress Notes (Signed)
Per insurance check for Tikosyn  #  2 .  S/W ANTOINETTE  @ [facility] # [phone]   1. TIKLOSYN 250 MCG BID  COVER- YES  CO-PAY- $ 71.73  TIER- 1 DRUG  PRIOR APPROVAL- NO    2. DOFETILIDE 250 MCG BID  COVER- YES  CO-PAY- $ 71.93  TIER- 1 DRUG  PRIOR APPROVAL- NO   DEDUCTIBLE : NOT MET  MAX OUT OF POCKET : NOT MET   PREFERRED PHARMACY : YES - [facility] AND [facility]

## 2018-01-08 NOTE — Progress Notes (Signed)
Pt converted to NSR with a first degree heart block

## 2018-01-08 NOTE — CV Procedure (Signed)
Direct current cardioversion:  Indication symptomatic A. Fibrillation/flutter   Procedure: Under deep sedation administered and monitored by anesthesiology, synchronized direct current cardioversion performed. Patient was delivered incremental doses of 120 J, 150 J, and 200 J of electricity X 3 without success to NSR. Patient tolerated the procedure well. No immediate complication noted.   Pre and post EKG shows atypical atrial flutter with 2:1 AV conduction, RBBB, and LAFB.  Will discuss with EP.  Elder NegusManish J Patwardhan, MD [facility] [place] Pager: [phone] Office: [phone] If no answer Cell [phone]

## 2018-01-08 NOTE — Interval H&P Note (Signed)
History and Physical Interval Note:  01/08/2018 11:03 AM  Clyda GreenerGregory Dresser  has presented today for surgery, with the diagnosis of aflutter  The various methods of treatment have been discussed with the patient and family. After consideration of risks, benefits and other options for treatment, the patient has consented to  Procedure(s): CARDIOVERSION (N/A) as a surgical intervention .  The patient's history has been reviewed, patient examined, no change in status, stable for surgery.  I have reviewed the patient's chart and labs.  Questions were answered to the patient's satisfaction.     Brenen Beigel J Emoni Yang

## 2018-01-09 ENCOUNTER — Encounter (HOSPITAL_COMMUNITY): Payer: Self-pay | Admitting: Cardiology

## 2018-01-09 ENCOUNTER — Telehealth: Payer: Self-pay

## 2018-01-09 DIAGNOSIS — G4733 Obstructive sleep apnea (adult) (pediatric): Secondary | ICD-10-CM

## 2018-01-09 DIAGNOSIS — I4819 Other persistent atrial fibrillation: Secondary | ICD-10-CM

## 2018-01-09 DIAGNOSIS — I429 Cardiomyopathy, unspecified: Secondary | ICD-10-CM

## 2018-01-09 DIAGNOSIS — I4892 Unspecified atrial flutter: Secondary | ICD-10-CM

## 2018-01-09 DIAGNOSIS — Z9989 Dependence on other enabling machines and devices: Secondary | ICD-10-CM

## 2018-01-09 DIAGNOSIS — E668 Other obesity: Secondary | ICD-10-CM

## 2018-01-09 DIAGNOSIS — I4891 Unspecified atrial fibrillation: Secondary | ICD-10-CM

## 2018-01-09 LAB — MAGNESIUM: MAGNESIUM: 1.9 mg/dL (ref 1.7–2.4)

## 2018-01-09 LAB — BASIC METABOLIC PANEL
Anion gap: 11 (ref 5–15)
BUN: 16 mg/dL (ref 6–20)
CO2: 26 mmol/L (ref 22–32)
CREATININE: 0.73 mg/dL (ref 0.61–1.24)
Calcium: 9.8 mg/dL (ref 8.9–10.3)
Chloride: 101 mmol/L (ref 101–111)
GFR calc Af Amer: >60 mL/min (ref >60)
GFR calc non Af Amer: >60 mL/min (ref >60)
Glucose, Bld: 145 mg/dL — ABNORMAL HIGH (ref 65–99)
Potassium: 4.1 mmol/L (ref 3.5–5.1)
SODIUM: 138 mmol/L (ref 135–145)

## 2018-01-09 LAB — GLUCOSE, CAPILLARY: Glucose-Capillary: 149 mg/dL — ABNORMAL HIGH (ref 65–99)

## 2018-01-09 MED ORDER — POTASSIUM CHLORIDE CRYS ER 20 MEQ PO TBCR: 20.0000 meq | EXTENDED_RELEASE_TABLET | Freq: Every day | ORAL | 3 refills | Status: DC

## 2018-01-09 MED ORDER — FUROSEMIDE 40 MG PO TABS: 40.0000 mg | ORAL_TABLET | Freq: Every day | ORAL | 3 refills | Status: DC

## 2018-01-09 MED ORDER — METOPROLOL SUCCINATE ER 50 MG PO TB24: 50.0000 mg | ORAL_TABLET | Freq: Every day | ORAL | 11 refills | Status: DC

## 2018-01-09 MED ORDER — HYDROCORTISONE 1 % EX CREA
1.0000 "application " | TOPICAL_CREAM | Freq: Three times a day (TID) | CUTANEOUS | Status: DC | PRN
  Filled 2018-01-09: qty 28

## 2018-01-09 NOTE — H&P (View-Only) (Signed)
Dr Addison Whidbee has seen patient this morning, full consult note to follow. Plan for PVI 01/30/18. Instructions entered in AVS. Reviewed with patient. Will plan cardiac CT prior to ablation, our office will call to schedule.  Amber Seiler, NP 01/09/2018 8:39 AM  Jelisa [place] MD, FACC 01/09/2018 11:46 AM   

## 2018-01-09 NOTE — Discharge Instructions (Signed)
You are scheduled for an AF ablation with Dr Johney FrameAllred on 01/30/18 at 7:30AM.  [facility] morning of procedure. Nothing to eat or drink after midnight the night before.  Do not miss any doses of Xarelto between now and procedure date. Do NOT hold Xarelto prior to procedure. Dr Jenel LucksAllred's office will call with instructions regarding lab work and cardiac CT scan to be done prior to procedure.  Our office number is [phone] if you have any questions or need to reschedule.  Boneta LucksJenny is Dr Jenel LucksAllred's nurse and will be happy to help.  Communicating through MyChart is also very effective.

## 2018-01-09 NOTE — Discharge Summary (Signed)
Physician Discharge Summary  Patient ID: Jose Cain MRN: [id number] DOB/AGE: 56/12/1960 56 y.o.  Admit date: 01/06/2018 Discharge date: 01/09/2018  Admission Diagnoses: Shortness of breath  Discharge Diagnoses:  Active Problems:   Atrial fibrillation (HCC)   Atrial flutter (HCC)   Moderate obesity   OSA on CPAP   Essential hypertension   Type 2 diabetes mellitus (HCC)   Atrial fibrillation and flutter (HCC)   Cardiomyopathy (HCC)   Discharged Condition: stable  Hospital Course:   The patient is a 57 year old male who presents for a Follow-up for Atrial fibrillation. [age] Caucasian male with hypertension, controlled type II diabetes mellitus, moderate obesity, OSA on CPAP, with recurrent episodes of both atrial fibrillation and typical as well as atypical atrial flutter.  He underwent cardioversion on 12/12/2017 with conversion to sinus rhythm. However, He has had recurrent atrial flutter as well as atrial fibrillation on subsequent visits. EF nited to be 30% while in atrial flutter with RVR. We had a long discussion regarding options for medical therapy including antiarrhythmic therapy, as well as ablation. Given his risk factors for the above arrhythmias, as well as combination of both atrial fibrillation and atrial flutter, we have mutually agreed on pursuing antiarrhythmic therapy with decrease in followed by another cardioversion. Patient is being electively admitted for tikosyn initiation followed by cardioversion on 01/08/2018. Marland Kitchen.  Patient was started on tikosyn 250 mg twice a day. This had to be stopped due to QTc prolongation. I attempted cardioversion on 01/08/2018 without success. He continues to be an atypical flutter with 2-1 AV block. Electrophysiology were consulted for possible ablation. Dr. Johney FrameAllred will perform PCI on 01/30/2018.  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  Continue losartan 100 mg daily. Switch metoprolol tartrate to metoprolol succinate 50 mg once a day. Continue Lasix 40 mg in the morning, an additional 40 mg in the afternoon as needed. Continue xarelto 20 mg daily.   Consults: EP  Significant Diagnostic Studies: Results for Jose Cain, Jose (MRN [id number]) as of 01/09/2018 10:20  Ref. Range 01/06/2018 22:20 01/07/2018 03:23 01/08/2018 03:17 01/09/2018 03:29  BASIC METABOLIC PANEL Unknown Rpt (A) Rpt (A) Rpt (A) Rpt (A)  Sodium Latest Ref Range: 135 - 145 mmol/L 138 139 137 138  Potassium Latest Ref Range: 3.5 - 5.1 mmol/L 3.6 4.0 4.0 4.1  Chloride Latest Ref Range: 101 - 111 mmol/L 101 103 102 101  CO2 Latest Ref Range: 22 - 32 mmol/L 27 27 26 26   Glucose Latest Ref Range: 65 - 99 mg/dL 086110 (H) 578110 (H) 469147 (H) 145 (H)  BUN Latest Ref Range: 6 - 20 mg/dL 11 13 16 16   Creatinine Latest Ref Range: 0.61 - 1.24 mg/dL 6.290.71 5.280.68 4.130.71 2.440.73  Calcium Latest Ref Range: 8.9 - 10.3 mg/dL 01.010.0 9.5 9.2 9.8  Anion gap Latest Ref Range: 5 - 15  10 9 9 11   Magnesium Latest Ref Range: 1.7 - 2.4 mg/dL 2.2 1.9 2.0 1.9  GFR, Est Non African American Latest Ref Range: >60 mL/min >60 >60 >60 >60  GFR, Est African American Latest Ref Range: >60 mL/min >60 >60 >60 >60   Results for Jose Cain, Jose Cain (MRN [id number]) as of 01/09/2018 10:20  Ref. Range 01/06/2018 15:46  WBC Latest Ref Range: 4.0 - 10.5 K/uL 8.0  RBC Latest Ref Range: 4.22 - 5.81 MIL/uL 4.18 (L)  Hemoglobin Latest Ref Range: 13.0 - 17.0 g/dL 03.412.5 (L)  HCT  Latest Ref Range: 39.0 - 52.0 % 39.2  MCV Latest Ref Range: 78.0 - 100.0 fL 93.8  MCH Latest Ref Range: 26.0 - 34.0 pg 29.9  MCHC Latest Ref Range: 30.0 - 36.0 g/dL 16.1  RDW Latest Ref Range: 11.5 - 15.5 % 15.8 (H)  Platelets Latest Ref Range: 150 - 400 K/uL 186  Neutrophils Latest Units: % 69  Lymphocytes Latest Units: % 21  Monocytes Relative Latest Units: % 8  Eosinophil Latest Units: % 2  Basophil Latest Units: % 0  NEUT# Latest Ref Range: 1.7 - 7.7  K/uL 5.5  Lymphocyte # Latest Ref Range: 0.7 - 4.0 K/uL 1.7  Monocyte # Latest Ref Range: 0.1 - 1.0 K/uL 0.7  Eosinophils Absolute Latest Ref Range: 0.0 - 0.7 K/uL 0.2  Basophils Absolute Latest Ref Range: 0.0 - 0.1 K/uL 0.0  Immature Granulocytes Latest Units: % 0  Abs Immature Granulocytes Latest Ref Range: 0.0 - 0.1 K/uL 0.0    Treatments: Medical management for atrial flutter/fibrillation Unsuccessful DC cardioversion  Discharge Exam: Blood pressure 109/73, pulse 98, temperature 97.9 F (36.6 C), temperature source Oral, resp. rate (!) 22, height 6' (1.829 m), weight 114 kg (251 lb 4.8 oz), SpO2 96 %. Nursing noteand vitalsreviewed. Constitutional: He isoriented to person, place, and time. He appearswell-developedand well-nourished.  HENT:  Head:Normocephalicand atraumatic.  Eyes:Pupils are equal, round, and reactive to light.Conjunctivaeare normal.  Neck:Normal range of motion.Neck supple.No JVDpresent.  Cardiovascular: Variable S1, II/VI apical holosystolic murmur Respiratory:Effort normaland breath sounds normal. He hasno wheezes. He hasno rales.  WR:UEAV.Bowel sounds are normal. There isno rebound.  Musculoskeletal: He exhibitsedema(1+ b/l).  Lymphadenopathy:  He has no cervical adenopathy.  Neurological: He isalertand oriented to person, place, and time.  Skin: Skin iswarmand dry.  Psychiatric: He has anormal mood and affect.      Disposition: Discharge disposition: 01-Home or Self Care       Discharge Instructions    Diet - low sodium heart healthy   Complete by:  As directed    Heart Failure patients record your daily weight using the same scale at the same time of day   Complete by:  As directed    Increase activity slowly   Complete by:  As directed      Allergies as of 01/09/2018      Reactions   Penicillins Swelling   Has patient had a PCN reaction causing immediate rash, facial/tongue/throat swelling, SOB or  lightheadedness with hypotension: yes Has patient had a PCN reaction causing severe rash involving mucus membranes or skin necrosis: no Has patient had a PCN reaction that required hospitalization: no Has patient had a PCN reaction occurring within the last 10 years: no If all of the above answers are "NO", then may proceed with Cephalosporin use.      Medication List    STOP taking these medications   DILT-XR 120 MG 24 hr capsule Generic drug:  diltiazem   ibuprofen 200 MG tablet Commonly known as:  ADVIL,MOTRIN   metoprolol tartrate 25 MG tablet Commonly known as:  LOPRESSOR     TAKE these medications   cetirizine 10 MG tablet Commonly known as:  ZYRTEC Take 10 mg by mouth daily.   Flaxseed Oil 1000 MG Caps Take 2,000 mg by mouth 2 (two) times daily.   furosemide 40 MG tablet Commonly known as:  LASIX Take 1 tablet (40 mg total) by mouth daily. Take another 40 mg in the afternoon as needed for leg edema What changed:  medication strength  how much to take  additional instructions   losartan 100 MG tablet Commonly known as:  COZAAR Take 100 mg by mouth at bedtime.   metFORMIN 1000 MG tablet Commonly known as:  GLUCOPHAGE Take 1,000 mg by mouth 2 (two) times daily with a meal.   metoprolol succinate 50 MG 24 hr tablet Commonly known as:  TOPROL XL Take 1 tablet (50 mg total) by mouth daily. Take with or immediately following a meal.(Either breaskfast or lunch)   multivitamin with minerals Tabs tablet Take 1 tablet by mouth daily.   potassium chloride SA 20 MEQ tablet Commonly known as:  K-DUR,KLOR-CON Take 1 tablet (20 mEq total) by mouth daily.   rivaroxaban 20 MG Tabs tablet Commonly known as:  XARELTO Take 20 mg by mouth at bedtime.      Follow-up Information    Elder Negus, MD Follow up on 01/17/2018.   Specialty:  Cardiology Why:  8:45 AM Contact information: [address] [place] [address] [phone]            Signed: Elder Negus 01/09/2018, 10:18 AM  Elder Negus, MD [facility] [place] Pager: [phone] Office: [phone] If no answer Cell [phone]

## 2018-01-09 NOTE — Telephone Encounter (Signed)
-----   Message from Marily LenteAmber K Seiler, NP sent at 01/09/2018  8:40 AM EDT ----- AF ablation - scheduled for 1st case 6/25. Needs cardiac CT scan prior.  I told him you would be calling with that info. Can you also schedule his pre-procedure labs? Nice guy - likes to talk  Thanks! Hospital doctoramber

## 2018-01-09 NOTE — Progress Notes (Signed)
Pt given discharge instructions, medication lists, follow up appointments, and when to call the doctor.  Pt verbalizes understanding. Pt given signs and symptoms of infection. Emelina Hinch McClintock, RN    

## 2018-01-09 NOTE — Progress Notes (Addendum)
Dr Johney FrameAllred has seen patient this morning, full consult note to follow. Plan for PVI 01/30/18. Instructions entered in AVS. Reviewed with patient. Will plan cardiac CT prior to ablation, our office will call to schedule.  Gypsy BalsamAmber Seiler, NP 01/09/2018 8:39 AM  Hillis RangeJames Elena Cothern MD, [facility] 01/09/2018 11:46 AM

## 2018-01-09 NOTE — Care Management Note (Signed)
Case Management Note Donn PieriniKristi Kyzer Blowe RN, BSN Unit 4E-Case Manager [phone]  Patient Details  Name: Jose GreenerGregory Marti MRN: [id number] Date of Birth: 09/08/1960  Subjective/Objective:  Pt admitted with afib for Tikosyn load, s/p Unsuccessful cardioversion 06/03 QTc prolonged- necessitating discontinuation of tikosyn.           Action/Plan: PTA pt lived at home, independent, [place]- however pt will not d/c home on Tikosyn due to prolonged QTc.- no other CM needs noted for transition home.   Expected Discharge Date:  01/09/18               Expected Discharge Plan:  Home/Self Care  In-House Referral:  NA  Discharge planning Services  CM Consult, Medication Assistance  Post Acute Care Choice:    Choice offered to:     DME Arranged:    DME Agency:     HH Arranged:    HH Agency:     Status of Service:  Completed, signed off  If discussed at Long Length of Stay Meetings, dates discussed:    Discharge Disposition: home/self care   Additional Comments:  Darrold SpanWebster, Takeysha Bonk Hall, RN 01/09/2018, 10:18 AM

## 2018-01-29 ENCOUNTER — Encounter (HOSPITAL_COMMUNITY): Payer: Self-pay

## 2018-01-29 ENCOUNTER — Ambulatory Visit (HOSPITAL_COMMUNITY): Payer: 59

## 2018-01-29 ENCOUNTER — Ambulatory Visit (HOSPITAL_COMMUNITY)
Admission: RE | Admit: 2018-01-29 | Discharge: 2018-01-29 | Disposition: A | Discharge status: Home / Self Care | Payer: 59 | Source: Ambulatory Visit | Attending: Internal Medicine | Admitting: Internal Medicine

## 2018-01-29 DIAGNOSIS — I4819 Other persistent atrial fibrillation: Secondary | ICD-10-CM

## 2018-01-29 DIAGNOSIS — I517 Cardiomegaly: Secondary | ICD-10-CM | POA: Diagnosis not present

## 2018-01-29 DIAGNOSIS — I481 Persistent atrial fibrillation: Secondary | ICD-10-CM | POA: Insufficient documentation

## 2018-01-29 DIAGNOSIS — I4891 Unspecified atrial fibrillation: Secondary | ICD-10-CM | POA: Diagnosis not present

## 2018-01-29 MED ORDER — IOPAMIDOL (ISOVUE-370) INJECTION 76%
INTRAVENOUS | Status: AC
  Administered 2018-01-29: 80 mL
  Filled 2018-01-29: qty 100

## 2018-01-30 ENCOUNTER — Encounter (HOSPITAL_COMMUNITY)
Admission: RE | Disposition: A | Discharge status: Home / Self Care | Payer: Self-pay | Source: Ambulatory Visit | Attending: Internal Medicine

## 2018-01-30 ENCOUNTER — Encounter (HOSPITAL_COMMUNITY): Payer: Self-pay | Admitting: Certified Registered Nurse Anesthetist

## 2018-01-30 ENCOUNTER — Ambulatory Visit (HOSPITAL_COMMUNITY)
Admission: RE | Admit: 2018-01-30 | Discharge: 2018-01-30 | Disposition: A | Discharge status: Home / Self Care | Payer: 59 | Source: Ambulatory Visit | Attending: Internal Medicine | Admitting: Internal Medicine

## 2018-01-30 ENCOUNTER — Ambulatory Visit (HOSPITAL_COMMUNITY): Payer: 59 | Admitting: Certified Registered Nurse Anesthetist

## 2018-01-30 DIAGNOSIS — I480 Paroxysmal atrial fibrillation: Secondary | ICD-10-CM | POA: Diagnosis not present

## 2018-01-30 DIAGNOSIS — Z87891 Personal history of nicotine dependence: Secondary | ICD-10-CM | POA: Diagnosis not present

## 2018-01-30 DIAGNOSIS — I10 Essential (primary) hypertension: Secondary | ICD-10-CM | POA: Insufficient documentation

## 2018-01-30 DIAGNOSIS — G4730 Sleep apnea, unspecified: Secondary | ICD-10-CM | POA: Insufficient documentation

## 2018-01-30 DIAGNOSIS — I483 Typical atrial flutter: Secondary | ICD-10-CM | POA: Insufficient documentation

## 2018-01-30 DIAGNOSIS — E119 Type 2 diabetes mellitus without complications: Secondary | ICD-10-CM | POA: Insufficient documentation

## 2018-01-30 DIAGNOSIS — Z7984 Long term (current) use of oral hypoglycemic drugs: Secondary | ICD-10-CM | POA: Diagnosis not present

## 2018-01-30 HISTORY — PX: ATRIAL FIBRILLATION ABLATION: EP1191

## 2018-01-30 LAB — POCT ACTIVATED CLOTTING TIME: Activated Clotting Time: 175 seconds

## 2018-01-30 LAB — GLUCOSE, CAPILLARY
GLUCOSE-CAPILLARY: 174 mg/dL — AB (ref 70–99)
Glucose-Capillary: 189 mg/dL — ABNORMAL HIGH (ref 70–99)

## 2018-01-30 SURGERY — ATRIAL FIBRILLATION ABLATION
Anesthesia: General

## 2018-01-30 MED ORDER — SUGAMMADEX SODIUM 200 MG/2ML IV SOLN
INTRAVENOUS | Status: DC | PRN
  Administered 2018-01-30: 200 mg via INTRAVENOUS

## 2018-01-30 MED ORDER — SUCCINYLCHOLINE CHLORIDE 20 MG/ML IJ SOLN
INTRAMUSCULAR | Status: DC | PRN
  Administered 2018-01-30: 120 mg via INTRAVENOUS

## 2018-01-30 MED ORDER — HEPARIN SODIUM (PORCINE) 1000 UNIT/ML IJ SOLN
INTRAMUSCULAR | Status: DC | PRN
  Administered 2018-01-30: 12000 [IU] via INTRAVENOUS
  Administered 2018-01-30: 1000 [IU] via INTRAVENOUS

## 2018-01-30 MED ORDER — ACETAMINOPHEN 325 MG PO TABS
ORAL_TABLET | ORAL | Status: AC
  Administered 2018-01-30: 325 mg
  Filled 2018-01-30: qty 2

## 2018-01-30 MED ORDER — ACETAMINOPHEN 325 MG PO TABS: 650.0000 mg | ORAL_TABLET | ORAL | Status: DC | PRN

## 2018-01-30 MED ORDER — HEPARIN SODIUM (PORCINE) 1000 UNIT/ML IJ SOLN
INTRAMUSCULAR | Status: AC
  Filled 2018-01-30: qty 2

## 2018-01-30 MED ORDER — HYDROCODONE-ACETAMINOPHEN 5-325 MG PO TABS: 1.0000 | ORAL_TABLET | ORAL | Status: DC | PRN

## 2018-01-30 MED ORDER — ONDANSETRON HCL 4 MG/2ML IJ SOLN: 4.0000 mg | Freq: Four times a day (QID) | INTRAMUSCULAR | Status: DC | PRN

## 2018-01-30 MED ORDER — ROCURONIUM BROMIDE 10 MG/ML (PF) SYRINGE
PREFILLED_SYRINGE | INTRAVENOUS | Status: DC | PRN
  Administered 2018-01-30 (×2): 20 mg via INTRAVENOUS
  Administered 2018-01-30: 60 mg via INTRAVENOUS

## 2018-01-30 MED ORDER — IOPAMIDOL (ISOVUE-370) INJECTION 76%
INTRAVENOUS | Status: AC
  Filled 2018-01-30: qty 50

## 2018-01-30 MED ORDER — SODIUM CHLORIDE 0.9 % IV SOLN: 250.0000 mL | INTRAVENOUS | Status: DC | PRN

## 2018-01-30 MED ORDER — SODIUM CHLORIDE 0.9 % IV SOLN
INTRAVENOUS | Status: DC
  Administered 2018-01-30: 06:00:00 via INTRAVENOUS

## 2018-01-30 MED ORDER — HEPARIN (PORCINE) IN NACL 2-0.9 UNITS/ML
INTRAMUSCULAR | Status: DC | PRN
  Administered 2018-01-30: 1000 mL

## 2018-01-30 MED ORDER — MIDAZOLAM HCL 2 MG/2ML IJ SOLN
INTRAMUSCULAR | Status: DC | PRN
  Administered 2018-01-30: 2 mg via INTRAVENOUS

## 2018-01-30 MED ORDER — PROTAMINE SULFATE 10 MG/ML IV SOLN
INTRAVENOUS | Status: DC | PRN
  Administered 2018-01-30: 10 mg via INTRAVENOUS
  Administered 2018-01-30: 30 mg via INTRAVENOUS

## 2018-01-30 MED ORDER — PROPOFOL 10 MG/ML IV BOLUS
INTRAVENOUS | Status: DC | PRN
  Administered 2018-01-30: 150 mg via INTRAVENOUS

## 2018-01-30 MED ORDER — SODIUM CHLORIDE 0.9% FLUSH: 3.0000 mL | INTRAVENOUS | Status: DC | PRN

## 2018-01-30 MED ORDER — DEXAMETHASONE SODIUM PHOSPHATE 10 MG/ML IJ SOLN
INTRAMUSCULAR | Status: DC | PRN
  Administered 2018-01-30: 5 mg via INTRAVENOUS

## 2018-01-30 MED ORDER — FENTANYL CITRATE (PF) 250 MCG/5ML IJ SOLN
INTRAMUSCULAR | Status: DC | PRN
  Administered 2018-01-30: 150 ug via INTRAVENOUS

## 2018-01-30 MED ORDER — HEPARIN (PORCINE) IN NACL 1000-0.9 UT/500ML-% IV SOLN
INTRAVENOUS | Status: AC
  Filled 2018-01-30: qty 500

## 2018-01-30 MED ORDER — BUPIVACAINE HCL (PF) 0.25 % IJ SOLN
INTRAMUSCULAR | Status: DC | PRN
  Administered 2018-01-30: 20 mL

## 2018-01-30 MED ORDER — BUPIVACAINE HCL (PF) 0.25 % IJ SOLN
INTRAMUSCULAR | Status: AC
  Filled 2018-01-30: qty 30

## 2018-01-30 MED ORDER — SODIUM CHLORIDE 0.9% FLUSH: 3.0000 mL | Freq: Two times a day (BID) | INTRAVENOUS | Status: DC

## 2018-01-30 MED ORDER — ONDANSETRON HCL 4 MG/2ML IJ SOLN
INTRAMUSCULAR | Status: DC | PRN
  Administered 2018-01-30: 4 mg via INTRAVENOUS

## 2018-01-30 MED ORDER — HEPARIN SODIUM (PORCINE) 1000 UNIT/ML IJ SOLN
INTRAMUSCULAR | Status: DC | PRN
  Administered 2018-01-30 (×2): 5000 [IU] via INTRAVENOUS
  Administered 2018-01-30: 2000 [IU] via INTRAVENOUS

## 2018-01-30 MED ORDER — PANTOPRAZOLE SODIUM 40 MG PO TBEC: 40.0000 mg | DELAYED_RELEASE_TABLET | Freq: Every day | ORAL | 0 refills | Status: DC

## 2018-01-30 MED ORDER — LIDOCAINE 2% (20 MG/ML) 5 ML SYRINGE
INTRAMUSCULAR | Status: DC | PRN
  Administered 2018-01-30: 100 mg via INTRAVENOUS

## 2018-01-30 SURGICAL SUPPLY — 17 items
BLANKET WARM UNDERBOD FULL ACC (MISCELLANEOUS) ×3 IMPLANT
CATH MAPPNG PENTARAY F 2-6-2MM (CATHETERS) ×1 IMPLANT
CATH NAVISTAR SMARTTOUCH DF (ABLATOR) ×3 IMPLANT
CATH SOUNDSTAR 3D IMAGING (CATHETERS) ×3 IMPLANT
CATH WEBSTER BI DIR CS D-F CRV (CATHETERS) ×3 IMPLANT
COVER SWIFTLINK CONNECTOR (BAG) ×3 IMPLANT
NEEDLE BAYLIS TRANSSEPTAL 71CM (NEEDLE) ×3 IMPLANT
PACK EP LATEX FREE (CUSTOM PROCEDURE TRAY) ×2
PACK EP LF (CUSTOM PROCEDURE TRAY) ×1 IMPLANT
PAD DEFIB LIFELINK (PAD) ×3 IMPLANT
PATCH CARTO3 (PAD) ×3 IMPLANT
PENTARAY F 2-6-2MM (CATHETERS) ×3
SHEATH AVANTI 11F 11CM (SHEATH) ×3 IMPLANT
SHEATH PINNACLE 7F 10CM (SHEATH) ×6 IMPLANT
SHEATH PINNACLE 9F 10CM (SHEATH) ×3 IMPLANT
SHEATH SWARTZ TS SL2 63CM 8.5F (SHEATH) ×3 IMPLANT
TUBING SMART ABLATE COOLFLOW (TUBING) ×3 IMPLANT

## 2018-01-30 NOTE — Anesthesia Procedure Notes (Signed)
Procedure Name: Intubation Date/Time: 01/30/2018 7:53 AM Performed by: White, Amedeo Plenty, CRNA Pre-anesthesia Checklist: Patient identified, Emergency Drugs available, Suction available and Patient being monitored Patient Re-evaluated:Patient Re-evaluated prior to induction Oxygen Delivery Method: Circle System Utilized Preoxygenation: Pre-oxygenation with 100% oxygen Induction Type: IV induction Laryngoscope Size: Mac and 4 Grade View: Grade III Tube type: Oral Tube size: 7.5 mm Number of attempts: 1 Airway Equipment and Method: Stylet and Oral airway Placement Confirmation: ETT inserted through vocal cords under direct vision,  positive ETCO2 and breath sounds checked- equal and bilateral Secured at: 23 cm Tube secured with: Tape Dental Injury: Teeth and Oropharynx as per pre-operative assessment

## 2018-01-30 NOTE — Interval H&P Note (Signed)
History and Physical Interval Note:  01/30/2018 7:14 AM  Jose GreenerGregory Ashmead  has presented today for surgery, with the diagnosis of afib  The various methods of treatment have been discussed with the patient and family. After consideration of risks, benefits and other options for treatment, the patient has consented to  Procedure(s): ATRIAL FIBRILLATION ABLATION (N/A) as a surgical intervention .  The patient's history has been reviewed, patient examined, no change in status, stable for surgery.  I have reviewed the patient's chart and labs.  Questions were answered to the patient's satisfaction.    Cardiac CT reviewed with patient.  He reports compliance with xarelto without interruption.  Hillis RangeJames Ladarrell Cornwall

## 2018-01-30 NOTE — Progress Notes (Signed)
7 fr,279fr,11 fr rfv sheaths removed- all with cath tips intact. Manual pressure held x 25 min with hemostasis achieved to all rfv site, r dp noted still with doppler, pt. Verbally instructed if he laughs, coughs, sneezes or bears down to hold pressure. Pt repeats back to me and r hand guided to rfv site and pt return demonstrated how he would hold pressure. Pt stabe. rfv site level 0, no ecchymosis no bleeding to site. Transported to PSS in stable condition.

## 2018-01-30 NOTE — Progress Notes (Signed)
To pss via rn and tele

## 2018-01-30 NOTE — Progress Notes (Signed)
Pt ambulated to bathroom without diff.  Dressing right groin dry and intact. Voided without diff.

## 2018-01-30 NOTE — Transfer of Care (Signed)
Immediate Anesthesia Transfer of Care Note  Patient: Jose GreenerGregory Zee  Procedure(s) Performed: ATRIAL FIBRILLATION ABLATION (N/A )  Patient Location: Cath Lab  Anesthesia Type:General  Level of Consciousness: awake, alert , oriented and patient cooperative  Airway & Oxygen Therapy: Patient Spontanous Breathing and Patient connected to nasal cannula oxygen  Post-op Assessment: Report given to RN and Post -op Vital signs reviewed and stable  Post vital signs: Reviewed and stable  Last Vitals:  Vitals Value Taken Time  BP    Temp    Pulse    Resp    SpO2      Last Pain:  Vitals:   01/30/18 0600  TempSrc:   PainSc: 0-No pain         Complications: No apparent anesthesia complications

## 2018-01-30 NOTE — Anesthesia Postprocedure Evaluation (Signed)
Anesthesia Post Note  Patient: Jose Cain  Procedure(s) Performed: ATRIAL FIBRILLATION ABLATION (N/A )     Patient location during evaluation: PACU Anesthesia Type: General Level of consciousness: awake and alert Pain management: pain level controlled Vital Signs Assessment: post-procedure vital signs reviewed and stable Respiratory status: spontaneous breathing, nonlabored ventilation, respiratory function stable and patient connected to nasal cannula oxygen Cardiovascular status: blood pressure returned to baseline and stable Postop Assessment: no apparent nausea or vomiting Anesthetic complications: no    Last Vitals:  Vitals:   01/30/18 1229 01/30/18 1300  BP: 120/87 120/80  Pulse: 80 95  Resp: 16 15  Temp: 36.7 C   SpO2: 99% 95%    Last Pain:  Vitals:   01/30/18 1229  TempSrc: Oral  PainSc:                  Lamin Chandley DAVID

## 2018-01-30 NOTE — Discharge Instructions (Signed)
Femoral Site Care °Refer to this sheet in the next few weeks. These instructions provide you with information about caring for yourself after your procedure. Your health care provider may also give you more specific instructions. Your treatment has been planned according to current medical practices, but problems sometimes occur. Call your health care provider if you have any problems or questions after your procedure. °What can I expect after the procedure? °After your procedure, it is typical to have the following: °· Bruising at the site that usually fades within 1-2 weeks. °· Blood collecting in the tissue (hematoma) that may be painful to the touch. It should usually decrease in size and tenderness within 1-2 weeks. ° °Follow these instructions at home: °· Take medicines only as directed by your health care provider. °· You may shower 24-48 hours after the procedure or as directed by your health care provider. Remove the bandage (dressing) and gently wash the site with plain soap and water. Pat the area dry with a clean towel. Do not rub the site, because this may cause bleeding. °· Do not take baths, swim, or use a hot tub until your health care provider approves. °· Check your insertion site every day for redness, swelling, or drainage. °· Do not apply powder or lotion to the site. °· Limit use of stairs to twice a day for the first 2-3 days or as directed by your health care provider. °· Do not squat for the first 2-3 days or as directed by your health care provider. °· Do not lift over 10 lb (4.5 kg) for 5 days after your procedure or as directed by your health care provider. °· Ask your health care provider when it is okay to: °? Return to work or school. °? Resume usual physical activities or sports. °? Resume sexual activity. °· Do not drive home if you are discharged the same day as the procedure. Have someone else drive you. °· You may drive 24 hours after the procedure unless otherwise instructed by  your health care provider. °· Do not operate machinery or power tools for 24 hours after the procedure or as directed by your health care provider. °· If your procedure was done as an outpatient procedure, which means that you went home the same day as your procedure, a responsible adult should be with you for the first 24 hours after you arrive home. °· Keep all follow-up visits as directed by your health care provider. This is important. °Contact a health care provider if: °· You have a fever. °· You have chills. °· You have increased bleeding from the site. Hold pressure on the site. °Get help right away if: °· You have unusual pain at the site. °· You have redness, warmth, or swelling at the site. °· You have drainage (other than a small amount of blood on the dressing) from the site. °· The site is bleeding, and the bleeding does not stop after 30 minutes of holding steady pressure on the site. °· Your leg or foot becomes pale, cool, tingly, or numb. °This information is not intended to replace advice given to you by your health care provider. Make sure you discuss any questions you have with your health care provider. °Document Released: 03/28/2014 Document Revised: 12/31/2015 Document Reviewed: 02/11/2014 °Elsevier Interactive Patient Education © 2018 [employer] ° ° ° °No driving for 4 days. No lifting over 5 lbs for 1 week. No sexual activity for 1 week. You may return to work in   1 week. Keep procedure site clean & dry. If you notice increased pain, swelling, bleeding or pus, call/return!  You may shower, but no soaking baths/hot tubs/pools for 1 week.  ° °You have an appointment set up with the [facility].  Multiple studies have shown that being followed by a dedicated atrial fibrillation clinic in addition to the standard care you receive from your other physicians improves health. We believe that enrollment in the atrial fibrillation clinic will allow us to better care for you.  ° °The  phone number to the [facility] Clinic is [phone]. The clinic is staffed Monday through Friday from 8:30am to 5pm. ° °Parking Directions: The clinic is located in the Heart and Vascular Building connected to [facility]. °1)From [address] turn on to [address] and go to the 3rd entrance  (Heart and Vascular entrance) on the right. °2)Look to the right for Heart &Vascular Parking Garage. °3)A code for the entrance is required please call the clinic to receive this.   °4)Take the elevators to the 1st floor. Registration is in the room with the glass walls at the end of the hallway. ° °If you have any trouble parking or locating the clinic, please don’t hesitate to call [phone]. ° °

## 2018-01-30 NOTE — Anesthesia Preprocedure Evaluation (Signed)
Anesthesia Evaluation  Patient identified by MRN, date of birth, ID band Patient awake    Reviewed: Allergy & Precautions, NPO status , Patient's Chart, lab work & pertinent test results  Airway Mallampati: I  TM Distance: >3 FB Neck ROM: Full    Dental   Pulmonary sleep apnea , former smoker,    Pulmonary exam normal        Cardiovascular hypertension, Pt. on medications Normal cardiovascular exam     Neuro/Psych    GI/Hepatic   Endo/Other  diabetes, Type 2, Oral Hypoglycemic Agents  Renal/GU      Musculoskeletal   Abdominal   Peds  Hematology   Anesthesia Other Findings   Reproductive/Obstetrics                             Anesthesia Physical Anesthesia Plan  ASA: III  Anesthesia Plan: MAC   Post-op Pain Management:    Induction: Intravenous  PONV Risk Score and Plan: 1  Airway Management Planned: Simple Face Mask  Additional Equipment:   Intra-op Plan:   Post-operative Plan:   Informed Consent: I have reviewed the patients History and Physical, chart, labs and discussed the procedure including the risks, benefits and alternatives for the proposed anesthesia with the patient or authorized representative who has indicated his/her understanding and acceptance.     Plan Discussed with: CRNA and Surgeon  Anesthesia Plan Comments:         Anesthesia Quick Evaluation

## 2018-01-31 LAB — POCT ACTIVATED CLOTTING TIME
ACTIVATED CLOTTING TIME: 274 s
Activated Clotting Time: 252 seconds
Activated Clotting Time: 307 seconds

## 2018-03-01 ENCOUNTER — Ambulatory Visit (HOSPITAL_COMMUNITY): Payer: 59 | Admitting: Nurse Practitioner

## 2018-03-08 ENCOUNTER — Ambulatory Visit (HOSPITAL_COMMUNITY)
Admission: RE | Admit: 2018-03-08 | Discharge: 2018-03-08 | Disposition: A | Discharge status: Home / Self Care | Payer: 59 | Source: Ambulatory Visit | Attending: Nurse Practitioner | Admitting: Nurse Practitioner

## 2018-03-08 ENCOUNTER — Encounter (HOSPITAL_COMMUNITY): Payer: Self-pay | Admitting: Nurse Practitioner

## 2018-03-08 VITALS — BP 126/80 | HR 103 | Ht 72.0 in | Wt 261.0 lb

## 2018-03-08 DIAGNOSIS — Z87442 Personal history of urinary calculi: Secondary | ICD-10-CM | POA: Diagnosis not present

## 2018-03-08 DIAGNOSIS — Z7901 Long term (current) use of anticoagulants: Secondary | ICD-10-CM | POA: Diagnosis not present

## 2018-03-08 DIAGNOSIS — Z6835 Body mass index (BMI) 35.0-35.9, adult: Secondary | ICD-10-CM | POA: Insufficient documentation

## 2018-03-08 DIAGNOSIS — Z880 Allergy status to penicillin: Secondary | ICD-10-CM | POA: Diagnosis not present

## 2018-03-08 DIAGNOSIS — E119 Type 2 diabetes mellitus without complications: Secondary | ICD-10-CM | POA: Diagnosis not present

## 2018-03-08 DIAGNOSIS — Z79899 Other long term (current) drug therapy: Secondary | ICD-10-CM | POA: Insufficient documentation

## 2018-03-08 DIAGNOSIS — Z7984 Long term (current) use of oral hypoglycemic drugs: Secondary | ICD-10-CM | POA: Insufficient documentation

## 2018-03-08 DIAGNOSIS — E669 Obesity, unspecified: Secondary | ICD-10-CM | POA: Insufficient documentation

## 2018-03-08 DIAGNOSIS — I10 Essential (primary) hypertension: Secondary | ICD-10-CM | POA: Diagnosis not present

## 2018-03-08 DIAGNOSIS — Z8249 Family history of ischemic heart disease and other diseases of the circulatory system: Secondary | ICD-10-CM | POA: Diagnosis not present

## 2018-03-08 DIAGNOSIS — I481 Persistent atrial fibrillation: Secondary | ICD-10-CM | POA: Insufficient documentation

## 2018-03-08 DIAGNOSIS — I4891 Unspecified atrial fibrillation: Secondary | ICD-10-CM | POA: Diagnosis present

## 2018-03-08 DIAGNOSIS — I4819 Other persistent atrial fibrillation: Secondary | ICD-10-CM

## 2018-03-08 DIAGNOSIS — Z87891 Personal history of nicotine dependence: Secondary | ICD-10-CM | POA: Diagnosis not present

## 2018-03-08 NOTE — Progress Notes (Signed)
Primary Care Physician: Gwenlyn Found, MD Referring Physician: Dr. Hendricks Milo Leinberger is a 57 y.o. male with a h/o persistent afib s/p ablation one month ago.He is in S tach today at 103 bpm, he usually runs in the 90's. He feels improved. No swallowing or groin issues.    Today, he denies symptoms of palpitations, chest pain, shortness of breath, orthopnea, PND, lower extremity edema, dizziness, presyncope, syncope, or neurologic sequela. The patient is tolerating medications without difficulties and is otherwise without complaint today.   Past Medical History:  Diagnosis Date  . Diabetes mellitus without complication (HCC)   . Hypertension   . Kidney stone   . Obesity   . Persistent atrial fibrillation [facility])    Past Surgical History:  Procedure Laterality Date  . ATRIAL FIBRILLATION ABLATION N/A 01/30/2018   Procedure: ATRIAL FIBRILLATION ABLATION;  Surgeon: [name], MD;  Location: [facility];  Service: Cardiovascular;  Laterality: N/A;  . CARDIOVERSION N/A 12/12/2017   Procedure: CARDIOVERSION;  Surgeon: Elder Negus, MD;  Location: [facility] ENDOSCOPY;  Service: Cardiovascular;  Laterality: N/A;  . CARDIOVERSION N/A 01/08/2018   Procedure: CARDIOVERSION;  Surgeon: Elder Negus, MD;  Location: [facility] ENDOS[facility];  Service: Cardiovascular;  Laterality: N/A;  . TEE WITHOUT CARDIOVERSION N/A 12/12/2017   Procedure: TRANSESOPHAGEAL ECHOCARDIOGRAM (TEE);  Surgeon: Elder Negus, MD;  Location: [facility] ENDOSCOPY;  Service: Cardiovascular;  Laterality: N/A;    Current Outpatient Medications  Medication Sig Dispense Refill  . cetirizine (ZYRTEC) 10 MG tablet Take 10 mg by mouth 2 (two) times daily.     . furosemide (LASIX) 40 MG tablet Take 1 tablet (40 mg total) by mouth daily. Take another 40 mg in the afternoon as needed for leg edema 60 tablet 3  . losartan (COZAAR) 100 MG tablet Take 100 mg by mouth at bedtime.   0  . metFORMIN (GLUCOPHAGE) 1000 MG tablet  Take 1,000 mg by mouth 2 (two) times daily with a meal.    . metoprolol succinate (TOPROL XL) 50 MG 24 hr tablet Take 1 tablet (50 mg total) by mouth daily. Take with or immediately following a meal.(Either breaskfast or lunch) 30 tablet 11  . Multiple Vitamin (MULTIVITAMIN WITH MINERALS) TABS tablet Take 1 tablet by mouth daily.    . Omega-3 Fatty Acids (FISH OIL) 1200 MG CAPS Take 2,400 mg by mouth 2 (two) times daily.    . potassium chloride SA (K-DUR,KLOR-CON) 20 MEQ tablet Take 1 tablet (20 mEq total) by mouth daily. 60 tablet 3  . rivaroxaban (XARELTO) 20 MG TABS tablet Take 20 mg by mouth at bedtime.      No current facility-administered medications for this encounter.     Allergies  Allergen Reactions  . Penicillins Swelling    Has patient had a PCN reaction causing immediate rash, facial/tongue/throat swelling, SOB or lightheadedness with hypotension: yes Has patient had a PCN reaction causing severe rash involving mucus membranes or skin necrosis: no Has patient had a PCN reaction that required hospitalization: no Has patient had a PCN reaction occurring within the last 10 years: no If all of the above answers are "NO", then may proceed with Cephalosporin use.     Social History   Socioeconomic History  . Marital status: Unknown    Spouse name: Not on file  . Number of children: Not on file  . Years of education: Not on file  . Highest education level: Not on file  Occupational History  .  Not on file  Social Needs  . Financial resource strain: Not on file  . Food insecurity:    Worry: Not on file    Inability: Not on file  . Transportation needs:    Medical: Not on file    Non-medical: Not on file  Tobacco Use  . Smoking status: Former Games developermoker  . Smokeless tobacco: Never Used  Substance and Sexual Activity  . Alcohol use: No  . Drug use: No  . Sexual activity: Not on file  Lifestyle  . Physical activity:    Days per week: Not on file    Minutes per session:  Not on file  . Stress: Not on file  Relationships  . Social connections:    Talks on phone: Not on file    Gets together: Not on file    Attends religious service: Not on file    Active member of club or organization: Not on file    Attends meetings of clubs or organizations: Not on file    Relationship status: Not on file  . Intimate partner violence:    Fear of current or ex partner: Not on file    Emotionally abused: Not on file    Physically abused: Not on file    Forced sexual activity: Not on file  Other Topics Concern  . Not on file  Social History Narrative  . Not on file    Family History  Problem Relation Age of Onset  . Coronary artery disease Mother     ROS- All systems are reviewed and negative except as per the HPI above  Physical Exam: Vitals:   03/08/18 0922  BP: 126/80  Pulse: (!) 103  Weight: 261 lb (118.4 kg)  Height: 6' (1.829 m)   Wt Readings from Last 3 Encounters:  03/08/18 261 lb (118.4 kg)  01/30/18 255 lb (115.7 kg)  01/09/18 251 lb 4.8 oz (114 kg)    Labs: Lab Results  Component Value Date   NA 138 01/09/2018   K 4.1 01/09/2018   CL 101 01/09/2018   CO2 26 01/09/2018   GLUCOSE 145 (H) 01/09/2018   BUN 16 01/09/2018   CREATININE 0.73 01/09/2018   CALCIUM 9.8 01/09/2018   MG 1.9 01/09/2018   No results found for: INR No results found for: CHOL, HDL, LDLCALC, TRIG   GEN- The patient is well appearing, alert and oriented x 3 today.   Head- normocephalic, atraumatic Eyes-  Sclera clear, conjunctiva pink Ears- hearing intact Oropharynx- clear Neck- supple, no JVP Lymph- no cervical lymphadenopathy Lungs- Clear to ausculation bilaterally, normal work of breathing Heart- Regular rate and rhythm, no murmurs, rubs or gallops, PMI not laterally displaced GI- soft, NT, ND, + BS Extremities- no clubbing, cyanosis, or edema MS- no significant deformity or atrophy Skin- no rash or lesion Psych- euthymic mood, full affect Neuro-  strength and sensation are intact  EKG- Sinus tach at 103 bpm, RBBB, pr int 180 ms, qrs int 130 bpm, qtc 484 ms Epic records reviewed    Assessment and Plan: 1. Persistent  afib  S/p ablation and feels improved Continue metoprolol without change  Continue xarelto 20 mg daily for a chadvasc score of 2 Resume normal activities as directed   2. HTN Stable   F/u with Dr. Johney FrameAllred 9/27  Elvina Sidleonna C. Matthew Folksarroll, ANP-C [facility] [address] [place], [place] [address] [phone]

## 2018-05-04 ENCOUNTER — Encounter: Payer: Self-pay | Admitting: Internal Medicine

## 2018-05-04 ENCOUNTER — Ambulatory Visit (INDEPENDENT_AMBULATORY_CARE_PROVIDER_SITE_OTHER): Payer: 59 | Admitting: Internal Medicine

## 2018-05-04 VITALS — BP 118/76 | HR 91 | Ht 72.0 in | Wt 265.0 lb

## 2018-05-04 DIAGNOSIS — I481 Persistent atrial fibrillation: Secondary | ICD-10-CM

## 2018-05-04 DIAGNOSIS — G4733 Obstructive sleep apnea (adult) (pediatric): Secondary | ICD-10-CM

## 2018-05-04 DIAGNOSIS — I10 Essential (primary) hypertension: Secondary | ICD-10-CM | POA: Diagnosis not present

## 2018-05-04 DIAGNOSIS — I4819 Other persistent atrial fibrillation: Secondary | ICD-10-CM

## 2018-05-04 MED ORDER — METOPROLOL SUCCINATE ER 25 MG PO TB24: 25.0000 mg | ORAL_TABLET | Freq: Every day | ORAL | 3 refills | Status: DC

## 2018-05-04 NOTE — Patient Instructions (Addendum)
Medication Instructions:  Your physician has recommended you make the following change in your medication:   1.  Reduce your Toprol XL 50 mg- Take 1/2 tablet by mouth daily.  You may split your current pills in half.  When they are gone your next prescription will be for 25 mg tablets.   Labwork: None ordered.  Testing/Procedures: None ordered.  Follow-Up: Your physician wants you to follow-up in: 3 months with Dr. Johney Frame.     Any Other Special Instructions Will Be Listed Below (If Applicable).  If you need a refill on your cardiac medications before your next appointment, please call your pharmacy.

## 2018-05-04 NOTE — Progress Notes (Signed)
PCP: Gwenlyn Found, MD Primary Cardiologist: Dr Lynnell Catalan is a 57 y.o. male who presents today for routine electrophysiology followup.  Since his recent afib ablation, the patient reports doing very well.  he denies procedure related complications and is pleased with the results of the procedure.  Today, he denies symptoms of palpitations, chest pain, shortness of breath,  lower extremity edema, dizziness, presyncope, or syncope.  The patient is otherwise without complaint today.   Past Medical History:  Diagnosis Date  . Diabetes mellitus without complication (HCC)   . Hypertension   . Kidney stone   . Obesity   . Persistent atrial fibrillation [facility])    Past Surgical History:  Procedure Laterality Date  . ATRIAL FIBRILLATION ABLATION N/A 01/30/2018   Procedure: ATRIAL FIBRILLATION ABLATION;  Surgeon: [name], MD;  [facility]: [facility];  Service: Cardiovascular;  Laterality: N/A;  . CARDIOVERSION N/A 12/12/2017   Procedure: CARDIOVERSION;  Surgeon: Elder Negus, MD;  Location: [facility] ENDOSCOPY;  Service: Cardiovascular;  Laterality: N/A;  . CARDIOVERSION N/A 01/08/2018   Procedure: CARDIOVERSION;  Surgeon: Elder Negus, MD;  Location: [facility] ENDOSCOPY;  Service: Cardiovascular;  Laterality: N/A;  . TEE WITHOUT CARDIOVERSION N/A 12/12/2017   Procedure: TRANSESOPHAGEAL ECHOCARDIOGRAM (TEE);  Surgeon: Elder Negus, MD;  Location: [facility] ENDOSCOPY;  Service: Cardiovascular;  Laterality: N/A;    ROS- all systems are personally reviewed and negatives except as per HPI above  Current Outpatient Medications  Medication Sig Dispense Refill  . cetirizine (ZYRTEC) 10 MG tablet Take 10 mg by mouth 2 (two) times daily.     . furosemide (LASIX) 40 MG tablet Take 1 tablet (40 mg total) by mouth daily. Take another 40 mg in the afternoon as needed for leg edema 60 tablet 3  . losartan (COZAAR) 100 MG tablet Take 100 mg by mouth at bedtime.   0  . metFORMIN  (GLUCOPHAGE) 1000 MG tablet Take 1,000 mg by mouth 2 (two) times daily with a meal.    . metoprolol succinate (TOPROL XL) 50 MG 24 hr tablet Take 1 tablet (50 mg total) by mouth daily. Take with or immediately following a meal.(Either breaskfast or lunch) 30 tablet 11  . Multiple Vitamin (MULTIVITAMIN WITH MINERALS) TABS tablet Take 1 tablet by mouth daily.    . Omega-3 Fatty Acids (FISH OIL) 1200 MG CAPS Take 2,400 mg by mouth 2 (two) times daily.    . potassium chloride SA (K-DUR,KLOR-CON) 20 MEQ tablet Take 1 tablet (20 mEq total) by mouth daily. 60 tablet 3  . rivaroxaban (XARELTO) 20 MG TABS tablet Take 20 mg by mouth at bedtime.      No current facility-administered medications for this visit.     Physical Exam: Vitals:   05/04/18 1559  BP: 118/76  Pulse: 91  SpO2: 98%  Weight: 265 lb (120.2 kg)  Height: 6' (1.829 m)    GEN- The patient is well appearing, alert and oriented x 3 today.   Head- normocephalic, atraumatic Eyes-  Sclera clear, conjunctiva pink Ears- hearing intact Oropharynx- clear Lungs- Clear to ausculation bilaterally, normal work of breathing Heart- Regular rate and rhythm, no murmurs, rubs or gallops, PMI not laterally displaced GI- soft, NT, ND, + BS Extremities- no clubbing, cyanosis, or edema  EKG tracing ordered today is personally reviewed and shows sinus rhythm 91 bpm, PR 174 msec, QRS 124 msec, Qtc 484 msec, RBBB  Assessment and Plan:  1. Persistent atrial fibrillation and atrial flutter  Doing well s/p ablation chads2vasc score is 2.  Continue on xarelto Reduce toprol to 25mg  daily  2. HTN Stable No change required today  3. OSA  He is now compliant with CPAP  4. Overweight Body mass index is 35.94 kg/m. Lifestyle modification encouraged  Return to see me in 3 months Follow-up with Dr Rosemary Holms as scheduled in 6 months  [name] MD, [facility] 05/04/2018 4:15 PM

## 2018-07-25 ENCOUNTER — Ambulatory Visit (INDEPENDENT_AMBULATORY_CARE_PROVIDER_SITE_OTHER): Payer: 59 | Admitting: Internal Medicine

## 2018-07-25 ENCOUNTER — Encounter: Payer: Self-pay | Admitting: Internal Medicine

## 2018-07-25 VITALS — BP 128/84 | HR 99 | Ht 72.0 in | Wt 272.0 lb

## 2018-07-25 DIAGNOSIS — I4819 Other persistent atrial fibrillation: Secondary | ICD-10-CM

## 2018-07-25 DIAGNOSIS — G4733 Obstructive sleep apnea (adult) (pediatric): Secondary | ICD-10-CM | POA: Diagnosis not present

## 2018-07-25 DIAGNOSIS — I10 Essential (primary) hypertension: Secondary | ICD-10-CM

## 2018-07-25 MED ORDER — METOPROLOL SUCCINATE ER 25 MG PO TB24: 25.0000 mg | ORAL_TABLET | Freq: Every day | ORAL | 2 refills | Status: DC

## 2018-07-25 NOTE — Progress Notes (Signed)
PCP: Jose Cain, Jose A, MD Primary Cardiologist: Dr Jose Cain Primary EP: Dr Jose Cain  Jose Cain is Cain 57 y.o. male who presents today for routine electrophysiology followup.  Since last being seen in our clinic, the patient reports doing very well.  Today, he denies symptoms of palpitations, chest pain, shortness of breath,  lower extremity edema, dizziness, presyncope, or syncope.  The patient is otherwise without complaint today.   Past Medical History:  Diagnosis Date  . Diabetes mellitus without complication (HCC)   . Hypertension   . Kidney stone   . Obesity   . Persistent atrial fibrillation    Past Surgical History:  Procedure Laterality Date  . ATRIAL FIBRILLATION ABLATION N/Cain 01/30/2018   Procedure: ATRIAL FIBRILLATION ABLATION;  Surgeon: Jose RangeAllred, Jose Mattern, MD;  [facility];  Service: Cardiovascular;  Laterality: N/Cain;  . CARDIOVERSION N/Cain 12/12/2017   Procedure: CARDIOVERSION;  Surgeon: Jose NegusPatwardhan, Jose J, MD;  [facility]: [facility];  Service: Cardiovascular;  Laterality: N/Cain;  . CARDIOVERSION N/Cain 01/08/2018   Procedure: CARDIOVERSION;  Surgeon: Jose NegusPatwardhan, Jose J, MD;  [facility]: [facility];  Service: Cardiovascular;  Laterality: N/Cain;  . TEE WITHOUT CARDIOVERSION N/Cain 12/12/2017   Procedure: TRANSESOPHAGEAL ECHOCARDIOGRAM (TEE);  Surgeon: Jose NegusPatwardhan, Jose J, MD;  [facility];  Service: Cardiovascular;  Laterality: N/Cain;    ROS- all systems are reviewed and negatives except as per HPI above  Current Outpatient Medications  Medication Sig Dispense Refill  . cetirizine (ZYRTEC) 10 MG tablet Take 10 mg by mouth 2 (two) times daily.     . furosemide (LASIX) 40 MG tablet Take 1 tablet (40 mg total) by mouth daily. Take another 40 mg in the afternoon as needed for leg edema 60 tablet 3  . losartan (COZAAR) 100 MG tablet Take 100 mg by mouth at bedtime.   0  . metFORMIN (GLUCOPHAGE) 1000 MG tablet Take 1,000 mg by mouth 2 (two) times daily with Cain  meal.    . metoprolol succinate (TOPROL XL) 25 MG 24 hr tablet Take 1 tablet (25 mg total) by mouth daily. 90 tablet 3  . Multiple Vitamin (MULTIVITAMIN WITH MINERALS) TABS tablet Take 1 tablet by mouth daily.    . Omega-3 Fatty Acids (FISH OIL) 1200 MG CAPS Take 2,400 mg by mouth 2 (two) times daily.    . potassium chloride SA (K-DUR,KLOR-CON) 20 MEQ tablet Take 1 tablet (20 mEq total) by mouth daily. 60 tablet 3  . rivaroxaban (XARELTO) 20 MG TABS tablet Take 20 mg by mouth at bedtime.      No current facility-administered medications for this visit.     Physical Exam: Vitals:   07/25/18 1015  BP: 128/84  Pulse: 99  SpO2: 98%  Weight: 272 lb (123.4 kg)  Height: 6' (1.829 m)    GEN- The patient is well appearing, alert and oriented x 3 today.   Head- normocephalic, atraumatic Eyes-  Sclera clear, conjunctiva pink Ears- hearing intact Oropharynx- clear Lungs- Clear to ausculation bilaterally, normal work of breathing Heart- Regular rate and rhythm, no murmurs, rubs or gallops, PMI not laterally displaced GI- soft, NT, ND, + BS Extremities- no clubbing, cyanosis, or edema  Wt Readings from Last 3 Encounters:  07/25/18 272 lb (123.4 kg)  05/04/18 265 lb (120.2 kg)  03/08/18 261 lb (118.4 kg)    EKG tracing ordered today is personally reviewed and shows sinus rhythm 99 bpm, PR 166 msec, QRS 126 msec, Qtc 492 msec  Assessment and Plan:  1. Persistent  afib/ atrial flutter Doing very well s/p ablation On xarelto for chads2vasc score of 2  2. HTN Stable No change required today  3. Obesity Body mass index is 36.89 kg/m. Lifestyle modification encouraged  4. OSA Compliant with CPAP  Follow-up with me in 6 months Follow-up with Dr Jose Holms as scheduled  Jose Range MD, [facility] 07/25/2018 10:33 AM

## 2018-07-25 NOTE — Patient Instructions (Addendum)
Medication Instructions:  Your physician recommends that you continue on your current medications as directed. Please refer to the Current Medication list given to you today.  * If you need a refill on your cardiac medications before your next appointment, please call your pharmacy.   Labwork: None ordered  Testing/Procedures: None ordered  Follow-Up: Your physician wants you to follow-up in: 6 months with Dr. Allred.  You will receive a reminder letter in the mail two months in advance. If you don't receive a letter, please call our office to schedule the follow-up appointment.   Thank you for choosing [facility]!!         

## 2018-07-25 NOTE — Addendum Note (Signed)
Addended by: Rowen Hur H on: 07/25/2018 05:32 PM   Modules accepted: Orders  

## 2018-07-26 ENCOUNTER — Encounter (INDEPENDENT_AMBULATORY_CARE_PROVIDER_SITE_OTHER): Payer: 59 | Admitting: Ophthalmology

## 2018-07-26 DIAGNOSIS — H35033 Hypertensive retinopathy, bilateral: Secondary | ICD-10-CM | POA: Diagnosis not present

## 2018-07-26 DIAGNOSIS — H43813 Vitreous degeneration, bilateral: Secondary | ICD-10-CM

## 2018-07-26 DIAGNOSIS — I10 Essential (primary) hypertension: Secondary | ICD-10-CM

## 2018-07-26 DIAGNOSIS — E113293 Type 2 diabetes mellitus with mild nonproliferative diabetic retinopathy without macular edema, bilateral: Secondary | ICD-10-CM | POA: Diagnosis not present

## 2018-07-26 DIAGNOSIS — E11319 Type 2 diabetes mellitus with unspecified diabetic retinopathy without macular edema: Secondary | ICD-10-CM

## 2018-10-25 NOTE — Progress Notes (Signed)
Patient is here for follow up visit.  Subjective:    ID: Jose Cain, male    DOB: 04-12-1961, 58 y.o.   MRN: [id number]  Chief Complaint  Patient presents with  . Atrial Fibrillation  . Atrial Flutter  . Follow-up    6 mth    HPI  16 Caucasian male with hypertension, controlled type II diabetes mellitus, moderate obesity, OSA on CPAP, paroxysmal Afib/flutter s/p successful ablation by Dr. Johney Frame on 01/30/2018.  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 He has been doing well, and walking whenever he can. He denies chest pain, shortness of breath, palpitations, leg edema, orthopnea, PND, TIA/syncope.  He continues to have faster heart rate at rest and exercise, without causing any significant symptoms. He had been off of furosemide and K for a while, with no significant increase in leg edema.   Past Medical History:  Diagnosis Date  . Diabetes mellitus without complication (HCC)   . Hypertension   . Kidney stone   . Obesity   . Persistent atrial fibrillation      Past Surgical History:  Procedure Laterality Date  . ATRIAL FIBRILLATION ABLATION N/A 01/30/2018   Procedure: ATRIAL FIBRILLATION ABLATION;  Surgeon: [name], MD;  [facility];  Service: Cardiovascular;  Laterality: N/A;  . CARDIOVERSION N/A 12/12/2017   Procedure: CARDIOVERSION;  Surgeon: Elder Negus, MD;  [facility];  Service: Cardiovascular;  Laterality: N/A;  . CARDIOVERSION N/A 01/08/2018   Procedure: CARDIOVERSION;  Surgeon: Elder Negus, MD;  [facility];  Service: Cardiovascular;  Laterality: N/A;  . TEE WITHOUT CARDIOVERSION N/A 12/12/2017   Procedure: TRANSESOPHAGEAL ECHOCARDIOGRAM (TEE);  Surgeon: Elder Negus, MD;  Location[facility];  Service: Cardiovascular;  Laterality: N/A;     Social History   Socioeconomic History  . Marital status: Married     Spouse name: Not on file  . Number of children: 3  . Years of education: Not on file  . Highest education level: Not on file  Occupational History  . Not on file  Social Needs  . Financial resource strain: Not on file  . Food insecurity:    Worry: Not on file    Inability: Not on file  . Transportation needs:    Medical: Not on file    Non-medical: Not on file  Tobacco Use  . Smoking status: Former Games developer  . Smokeless tobacco: Never Used  Substance and Sexual Activity  . Alcohol use: No  . Drug use: No  . Sexual activity: Not on file  Lifestyle  . Physical activity:    Days per week: Not on file    Minutes per session: Not on file  . Stress: Not on file  Relationships  . Social connections:    Talks on phone: Not on file    Gets together: Not on file    Attends religious service: Not on file    Active member of club or organization: Not on file    Attends meetings of clubs or organizations: Not on file    Relationship status: Not on file  . Intimate partner violence:    Fear of current or ex partner: Not on file    Emotionally abused: Not on file    Physically abused: Not on file    Forced sexual activity: Not on file  Other Topics Concern  . Not on file  Social History Narrative  .  Not on file     Current Outpatient Medications on File Prior to Visit  Medication Sig Dispense Refill  . cetirizine (ZYRTEC) 10 MG tablet Take 10 mg by mouth 2 (two) times daily.     . Empagliflozin-metFORMIN HCl 12-998 MG TABS Take 1 tablet by mouth 2 (two) times daily.    Marland Kitchen losartan (COZAAR) 100 MG tablet Take 50 mg by mouth at bedtime.   0  . metoprolol succinate (TOPROL XL) 25 MG 24 hr tablet Take 1 tablet (25 mg total) by mouth daily. 90 tablet 2  . Multiple Vitamin (MULTIVITAMIN WITH MINERALS) TABS tablet Take 1 tablet by mouth daily.    . Omega-3 Fatty Acids (FISH OIL) 1200 MG CAPS Take 2,400 mg by mouth 2 (two) times daily.    . rivaroxaban (XARELTO) 20 MG TABS tablet Take  20 mg by mouth at bedtime.     . furosemide (LASIX) 40 MG tablet Take 1 tablet (40 mg total) by mouth daily. Take another 40 mg in the afternoon as needed for leg edema (Patient not taking: Reported on 10/26/2018) 60 tablet 3  . metFORMIN (GLUCOPHAGE) 1000 MG tablet Take 1,000 mg by mouth 2 (two) times daily with a meal.    . potassium chloride SA (K-DUR,KLOR-CON) 20 MEQ tablet Take 1 tablet (20 mEq total) by mouth daily. (Patient not taking: Reported on 10/26/2018) 60 tablet 3   No current facility-administered medications on file prior to visit.     Cardiovascular studies:  EKG 10/26/2018: Sinus tachycardia  Right bundle branch block with left axis -bifascicular block.  Inferior infarct -age undetermined.  No significant change compared to previous EKG in 07/2018.  Echocardiogram 04/12/2018: Left ventricle cavity is normal in size. Moderate concentric hypertrophy of the left ventricle. Normal diastolic filling pattern. Normal global wall motion. Normal LV systolic function. Calculated EF 55%. Compared to 12/29/2017, EF has improved from 35% and sevre LA enlargement is not noted now.  Ablation 01/30/2018: Atrial fibrillation and flutter ablation by Dr. [name]  CT cardiac morph/pulm vein 01/29/2018: No significant non-cardiac abnormality seen in visualized portion of the thorax.  EKG 01/17/2018: Atypical atrial flutter with 2:1 A-V conduction. Ventricular rate 118 bpm. ) Bundle branch block left anterior fascicular block. Lateral T wave inversions, cannot exclude ischemia. EKG 12/22/2017: Atrial fibrillation with rapid ventricular response 119 bpm. Right branch block, left anterior fascicular block. No ischemic changes. EKG 12/08/2017: Atypical atrial flutter with variable conduction. Average ventricular rate 140 bpm. Right bundle branch block, left anterior fascicular block. Nonspecific ST-T changes. Low-voltage. CHA2DS2VASc score 2: Annual stroke risk 2.2%  Cardioversion [01/08/2018]:  Successful cardioversion 120 J X2  Echocardiogram TEE 12/12/2017: - Left ventricle: The estimated ejection fraction was in the range of 50% to 55%. - Mitral valve: There was mild to moderate regurgitation. - Left atrium: The atrium was moderately dilated. No evidence of thrombus in the atrial cavity or appendage. Acoustic contrast opacification revealed no evidence ofthrombusin the atrial cavity or appendage. Emptying velocity was mildly reduced.  EKG 12/22/2017: Atrial fibrillation with rapid ventricular response 127 bpm. Right bundle branch block. Left anterior fascicular block. No iscehmic changes.  Sleep study xx/xx/20xx: positive for sleep apnea. On CPAP and compliant.    Recent labs: 02/16/2018: HbA1c 6.9%.  Lipid Profile Component Name Value Ref Range  LDL Direct 122 <150 mg/dL  Total Cholesterol 161 25 - 199 MG/DL  Triglycerides 096 (H) 10 - 150 MG/DL  HDL Cholesterol 31 (L) 35 - 135 MG/DL  Total Chol /  HDL Cholesterol 4.9 (H) <=4.5   Non-HDL Cholesterol 121  MG/DL   Comprehensive Metabolic Panel  Component Name Value Ref Range  Sodium 139 135 - 146 MMOL/L  Potassium 4.3 3.5 - 5.3 MMOL/L  Chloride 104 98 - 110 MMOL/L  CO2 27 23 - 30 MMOL/L  BUN 19 8 - 24 MG/DL  Glucose 086 (H) 70 - 99 MG/DL  Creatinine 7.61 0.5 - 1.5 MG/DL  Calcium 9.5 8.5 - 95.0 MG/DL  Total Protein 6.7 6 - 8.3 G/DL  Albumin  3.8 3.5 - 5 G/DL  Total Bilirubin 0.6 0.1 - 1.2 MG/DL  Alkaline Phosphatase 932 25 - 125 IU/L  AST (SGOT) 25 5 - 40 IU/L  ALT (SGPT) 24 5 - 50 IU/L  Anion Gap 9 4 - 14 MMOL/L  Est. GFR Non-African American >=90  >=60 ML/MIN/1.73 M*2    CBC Latest Ref Rng & Units 01/06/2018  WBC 4.0 - 10.5 K/uL 8.0  Hemoglobin 13.0 - 17.0 g/dL 12.5(L)  Hematocrit 39.0 - 52.0 % 39.2  Platelets 150 - 400 K/uL 186     Review of Systems  Constitution: Negative for decreased appetite, malaise/fatigue, weight gain and weight loss.  HENT: Negative for congestion.   Eyes: Negative for visual  disturbance.  Cardiovascular: Positive for leg swelling (Occasional leg swelling). Negative for chest pain, dyspnea on exertion (improved), palpitations and syncope.  Respiratory: Negative for shortness of breath.   Endocrine: Negative for cold intolerance.  Hematologic/Lymphatic: Does not bruise/bleed easily.  Skin: Negative for itching and rash.  Musculoskeletal: Negative for myalgias.  Gastrointestinal: Negative for abdominal pain, nausea and vomiting.  Genitourinary: Negative for dysuria.  Neurological: Negative for dizziness and weakness.  Psychiatric/Behavioral: The patient is not nervous/anxious.   All other systems reviewed and are negative.      Objective:    Vitals:   10/26/18 1025  BP: 112/74  Pulse: (!) 113  SpO2: 97%     Physical Exam  Constitutional: He is oriented to person, place, and time. He appears well-developed and well-nourished. No distress.  Moderately Obese   HENT:  Head: Normocephalic and atraumatic.  Eyes: Pupils are equal, round, and reactive to light. Conjunctivae are normal.  Neck: No JVD present.  Cardiovascular: Normal rate, regular rhythm and intact distal pulses.  Pulmonary/Chest: Effort normal and breath sounds normal. He has no wheezes. He has no rales.  Abdominal: Soft. Bowel sounds are normal. There is no rebound.  Musculoskeletal:        General: Edema (Mild RLE edema) present.  Lymphadenopathy:    He has no cervical adenopathy.  Neurological: He is alert and oriented to person, place, and time. No cranial nerve deficit.  Skin: Skin is warm and dry.  Psychiatric: He has a normal mood and affect.  Nursing note and vitals reviewed.       Assessment & Recommendations:    8 Caucasian male with hypertension, controlled type II diabetes mellitus, moderate obesity, OSA on CPAP, paroxysmal Afib/flutter s/p successful ablation by Dr. 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  Paroxysmal  Afib/flutter: S/p successful ablation 03/2018 His EKG shows tachycardia, possibly ectopic atrial tachycardia, without the flutter waves seen prior to ablation. I have asked him to monitor his heart rate.  Continue metoprolol succinate 25 mg daily for now.  CHA2DS2VAsc score 3 with h/o heart failure. In absence of any bleeding issues, I would continue Xarelto 20 mg once daily.  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  EF of 55%.  Hypertension: Controlled.   I will see him back in 6 months.   Elder Negus, MD [facility] Pager: [phone] Office: [phone] If no answer Cell [phone]

## 2018-10-26 ENCOUNTER — Ambulatory Visit (INDEPENDENT_AMBULATORY_CARE_PROVIDER_SITE_OTHER): Payer: 59 | Admitting: Cardiology

## 2018-10-26 ENCOUNTER — Encounter: Payer: Self-pay | Admitting: Cardiology

## 2018-10-26 ENCOUNTER — Other Ambulatory Visit: Payer: Self-pay

## 2018-10-26 VITALS — BP 112/74 | HR 113 | Ht 72.0 in | Wt 266.5 lb

## 2018-10-26 DIAGNOSIS — I10 Essential (primary) hypertension: Secondary | ICD-10-CM | POA: Diagnosis not present

## 2018-10-26 DIAGNOSIS — I4892 Unspecified atrial flutter: Secondary | ICD-10-CM

## 2018-10-26 DIAGNOSIS — I480 Paroxysmal atrial fibrillation: Secondary | ICD-10-CM

## 2018-10-27 ENCOUNTER — Encounter: Payer: Self-pay | Admitting: Cardiology

## 2018-11-29 IMAGING — CT CT HEART MORPH/PULM VEIN W/ CM & W/O CA SCORE
3 of 6 series · 11 of 20 positions shown, 13 images · IV contrast (APPLIED)
Comparison: None.

EXAM:
OVER-READ INTERPRETATION  CT CHEST

The following report is an over-read performed by radiologist Dr.
does not include interpretation of cardiac or coronary anatomy or
pathology. The cardiac CT interpretation by the cardiologist is
attached.
CLINICAL DATA: Atrial fibrillation scheduled for an ablation.
Cardiac CT/CTA
TECHNIQUE: The patient was scanned on a Siemens Force  scanner.

[Series 6: best diast · axial · 0.39mm/px · z∈[+1315,+1404]mm · 4 of 371 slices shown]
[im 75/371  vessel]
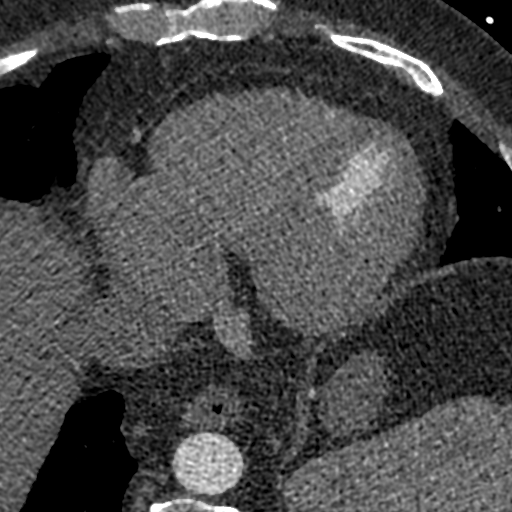
[im 149/371  vessel]
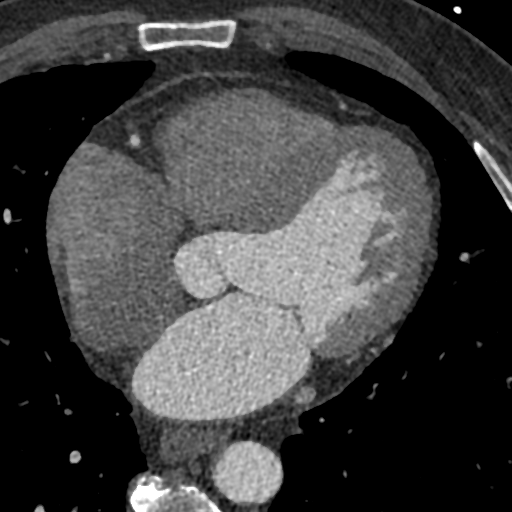
[im 223/371  vessel]
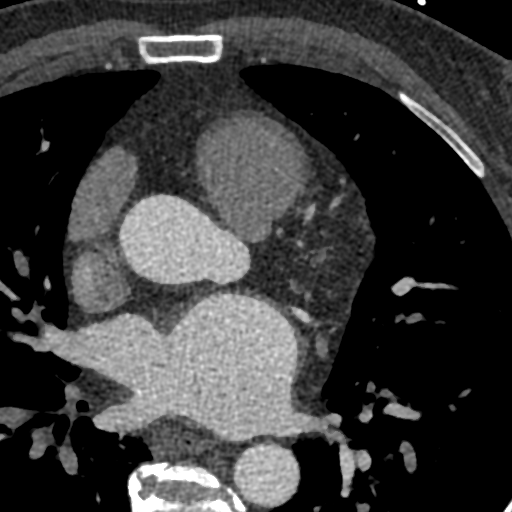
[im 297/371  vessel]
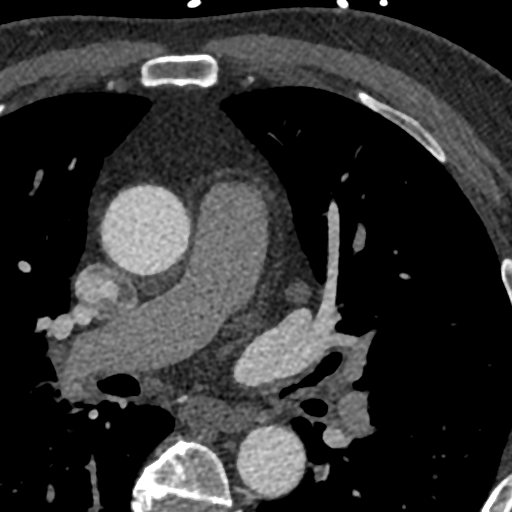

[Series 7: +300 ms · axial · 0.39mm/px · z∈[+1310,+1409]mm · 5 of 371 slices shown, 7 images]
[im 62/371  vessel]
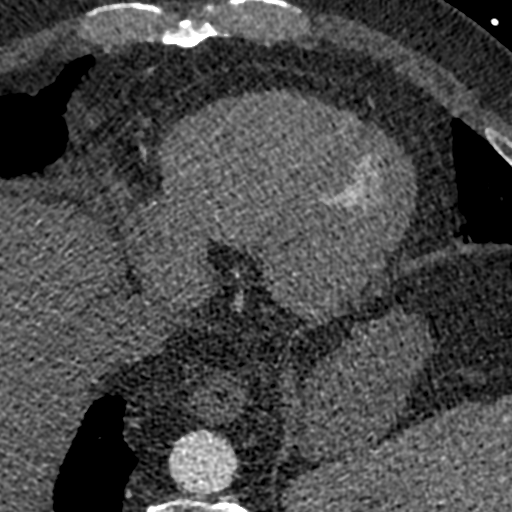
[im 62/371  lung]
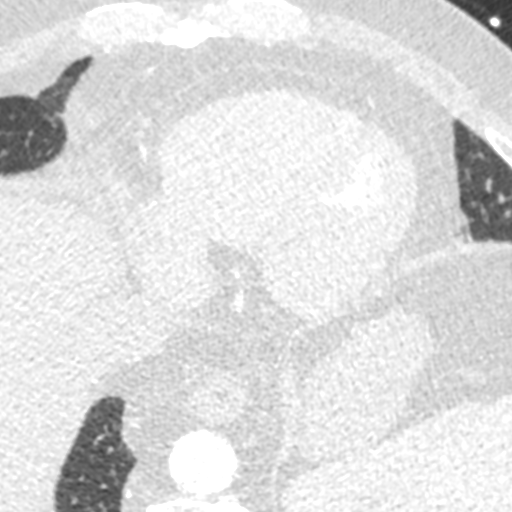
[im 124/371  vessel]
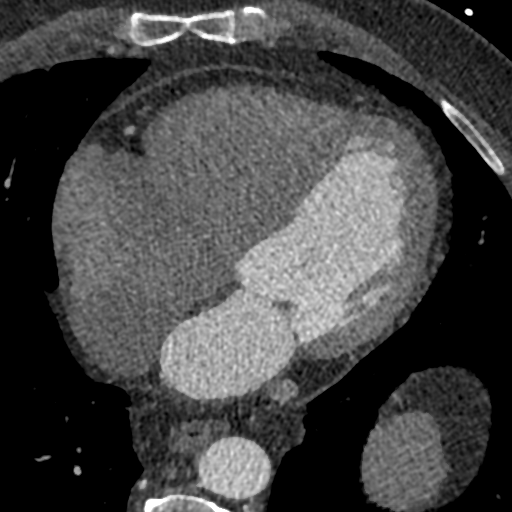
[im 186/371  vessel]
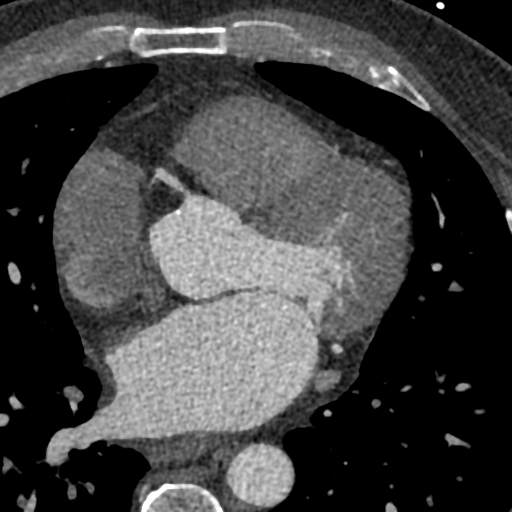
[im 247/371  vessel]
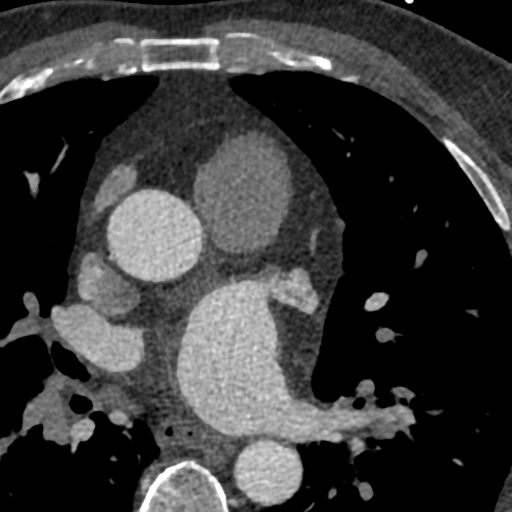
[im 309/371  vessel]
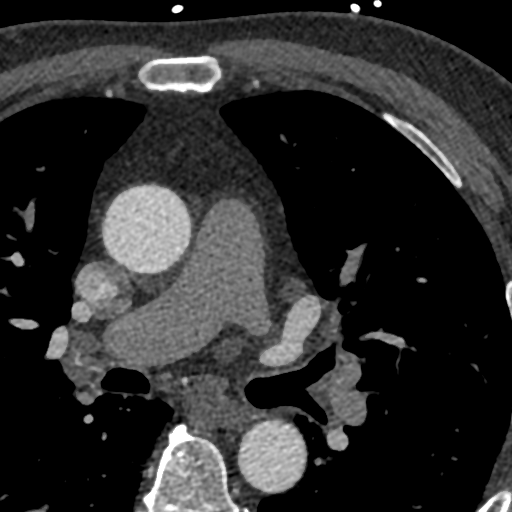
[im 309/371  lung]
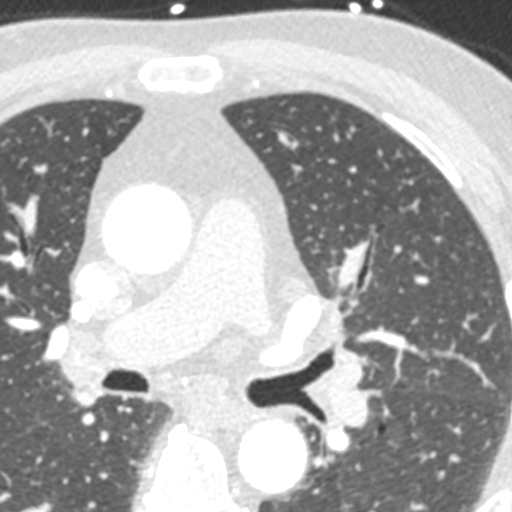

[Series 14: +300ms delay · axial · delayed · 0.39mm/px · z∈[+1360,+1395]mm · 2 of 213 slices shown]
[im 71/213  vessel]
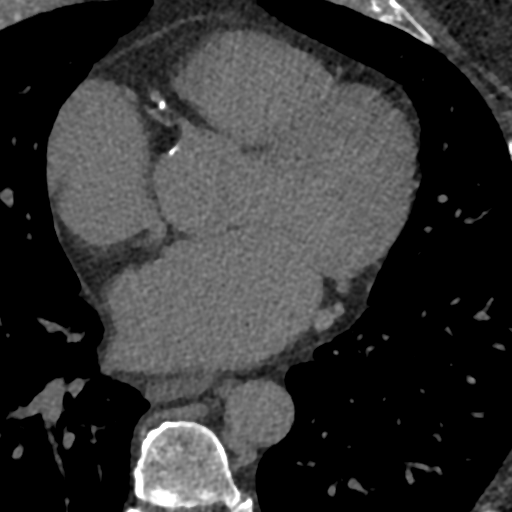
[im 142/213  vessel]
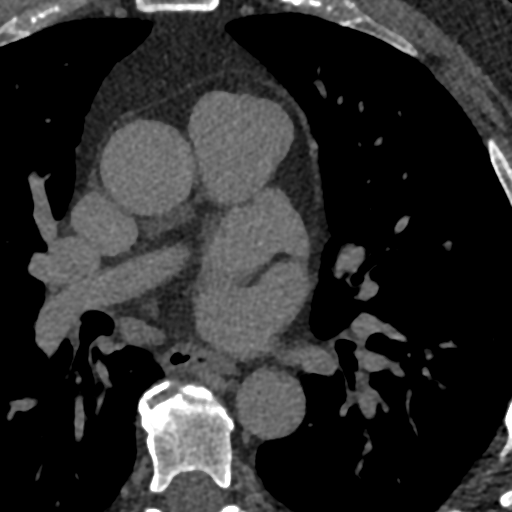

[11 of 20 positions shown; findings below may reference images not displayed]

FINDINGS: The visualized portions of the lower lung fields show no suspicious
nodules, masses, or infiltrates. The visualized portions of the
mediastinum and chest wall are unremarkable.
IMPRESSION: No significant non-cardiac abnormality seen in visualized portion of
the thorax.
FINDINGS: A 120 kV prospective scan was triggered in the descending thoracic
aorta at 111 HU's. Gantry rotation speed was 280 msecs and
collimation was .9 mm. No beta blockade and no NTG was given. The 3D
data set was reconstructed in 5% intervals of the 60-80 % of the R-R
cycle. Diastolic phases were analyzed on a dedicated work station
using MPR, MIP and VRT modes. The patient received 80 cc of
contrast.

There was no OE JAIR thrombus. The PV;s drained normally into the LA.
The right lower PV trifurcated. There was moderate bi atrial
enlargement. There was no ASD/PFO. There was no pericardial effusion

The aortic root was normal at 3.7 cm

RUPV: Ostium 25 mm Area 5.6 cm2

RLPV: Ostium 20 mm Area 4.2 cm2

LUPV: Ostium 21 mm Area 3.8 cm2

LLPV: Ostium 21 mm Area 2.5 cm2

There was significant LM and 3 vessel coronary calcium noted
IMPRESSION: 1.  No OE JAIR thrombus

2.  Normal PV anatomy see dimensions above the RLPV trifurcates

3.  Moderate bi atrial enlargement

4.  No ASD/PFO

5.  Normal aortic root 3.7 cm

6.  No pericardial effusion

7.  Calcium score of 463 which is 95 th percentile for age and sex

Mayra Hulse

EXAM:
Cardiac CTA

MEDICATIONS:
None

Mayra Hulse

## 2019-01-16 ENCOUNTER — Telehealth: Payer: Self-pay

## 2019-01-18 NOTE — Telephone Encounter (Signed)
Spoke with pt regarding appt on 01/21/19. Pt stated he does not have any questions at this moment regarding his appt.

## 2019-01-21 ENCOUNTER — Telehealth (INDEPENDENT_AMBULATORY_CARE_PROVIDER_SITE_OTHER): Payer: 59 | Admitting: Internal Medicine

## 2019-01-21 ENCOUNTER — Other Ambulatory Visit: Payer: Self-pay | Admitting: Internal Medicine

## 2019-01-21 ENCOUNTER — Encounter: Payer: Self-pay | Admitting: Internal Medicine

## 2019-01-21 VITALS — BP 122/79 | HR 91 | Ht 72.0 in | Wt 264.0 lb

## 2019-01-21 DIAGNOSIS — G4733 Obstructive sleep apnea (adult) (pediatric): Secondary | ICD-10-CM

## 2019-01-21 DIAGNOSIS — I480 Paroxysmal atrial fibrillation: Secondary | ICD-10-CM

## 2019-01-21 NOTE — Progress Notes (Signed)
Electrophysiology TeleHealth Note   Due to national recommendations of social distancing due to COVID 19, an audio/video telehealth visit is felt to be most appropriate for this patient at this time.  See MyChart message from today for the patient's consent to telehealth for [facility].   Date:  01/21/2019   ID:  Jose Cain, DOB 05/28/1961, MRN [id number]  Location: patient's home  Provider location:  [place]  Evaluation Performed: Follow-up visit  PCP:  Nickola Major, MD   Electrophysiologist:  Dr Rayann Heman  Chief Complaint:  palpitations  History of Present Illness:    Jose Cain is a 58 y.o. male who presents via telehealth conferencing today.  Since last being seen in our clinic, the patient reports doing very well.  He is very pleased with his AF ablation results.  No symptomatic events.  Today, he denies symptoms of palpitations, chest pain, shortness of breath,  lower extremity edema, dizziness, presyncope, or syncope.  The patient is otherwise without complaint today.  The patient denies symptoms of fevers, chills, cough, or new SOB worrisome for COVID 19.  Past Medical History:  Diagnosis Date  . Diabetes mellitus without complication ([place])   . Hypertension   . Kidney stone   . Obesity   . Persistent atrial fibrillation     Past Surgical History:  Procedure Laterality Date  . ATRIAL FIBRILLATION ABLATION N/A 01/30/2018   Procedure: ATRIAL FIBRILLATION ABLATION;  Surgeon: Thompson Grayer, MD;  Location: [place] CV LAB;  Service: Cardiovascular;  Laterality: N/A;  . CARDIOVERSION N/A 12/12/2017   Procedure: CARDIOVERSION;  Surgeon: Nigel Mormon, MD;  Location: [place] ENDOSCOPY;  Service: Cardiovascular;  Laterality: N/A;  . CARDIOVERSION N/A 01/08/2018   Procedure: CARDIOVERSION;  Surgeon: Nigel Mormon, MD;  Location: [place] ENDOSCOPY;  Service: Cardiovascular;  Laterality: N/A;  . TEE WITHOUT CARDIOVERSION N/A 12/12/2017   Procedure:  TRANSESOPHAGEAL ECHOCARDIOGRAM (TEE);  Surgeon: Nigel Mormon, MD;  Location: [facility] ENDOSCOPY;  Service: Cardiovascular;  Laterality: N/A;    Current Outpatient Medications  Medication Sig Dispense Refill  . cetirizine (ZYRTEC) 10 MG tablet Take 10 mg by mouth 2 (two) times daily.     . Empagliflozin-metFORMIN HCl 12-998 MG TABS Take 1 tablet by mouth 2 (two) times daily.    Marland Kitchen losartan (COZAAR) 100 MG tablet Take 50 mg by mouth at bedtime.   0  . metoprolol succinate (TOPROL XL) 25 MG 24 hr tablet Take 1 tablet (25 mg total) by mouth daily. 90 tablet 2  . Multiple Vitamin (MULTIVITAMIN WITH MINERALS) TABS tablet Take 1 tablet by mouth daily.    . Omega-3 Fatty Acids (FISH OIL) 1200 MG CAPS Take 2,400 mg by mouth 2 (two) times daily.    . rivaroxaban (XARELTO) 20 MG TABS tablet Take 20 mg by mouth at bedtime.      No current facility-administered medications for this visit.     Allergies:   Penicillins   Social History:  The patient  reports that he has quit smoking. He has never used smokeless tobacco. He reports that he does not drink alcohol or use drugs.   Family History:  The patient's  family history includes Coronary artery disease in his mother; Diabetes in his father; Heart disease in his mother.   ROS:  Please see the history of present illness.   All other systems are personally reviewed and negative.    Exam:    Vital Signs:  BP 122/79   Pulse 91  Ht 6' (1.829 m)   Wt 264 lb (119.7 kg)   BMI 35.80 kg/m   Well appearing, NAD, A&Ox3   Labs/Other Tests and Data Reviewed:    Recent Labs: No results found for requested labs within last 8760 hours.   Wt Readings from Last 3 Encounters:  01/21/19 264 lb (119.7 kg)  10/26/18 266 lb 8 oz (120.9 kg)  07/25/18 272 lb (123.4 kg)     ASSESSMENT & PLAN:    1. Paroxysmal atrial fibrillation/ atrial flutter Resolved post ablation off AAD therapy chads2vasc score is 2 (HTN,DM...EF normalized post ablation) Continue  xarelto at this time.   2. HTN Stable No change required today  3. Obesity He has lost 8 lbs in the past 6 months!  4. OSA Compliance with CPAP encouraged  Follow-up:  12 months with me,  He will see Dr Rosemary HolmsPatwardhan as scheduled   Patient Risk:  after full review of this patients clinical status, I feel that they are at moderate risk at this time.  Today, I have spent 15 minutes with the patient with telehealth technology discussing arrhythmia management .    Randolm IdolSigned, Tulani Kidney, MD  01/21/2019 8:32 AM     [facility] [address] [place] [address] [phone] (office) [phone] (fax)

## 2019-05-03 ENCOUNTER — Encounter: Payer: Self-pay | Admitting: Cardiology

## 2019-05-03 ENCOUNTER — Ambulatory Visit (INDEPENDENT_AMBULATORY_CARE_PROVIDER_SITE_OTHER): Payer: 59 | Admitting: Cardiology

## 2019-05-03 ENCOUNTER — Other Ambulatory Visit: Payer: Self-pay

## 2019-05-03 VITALS — BP 129/90 | HR 97 | Temp 97.1°F | Ht 72.0 in | Wt 267.9 lb

## 2019-05-03 DIAGNOSIS — G4733 Obstructive sleep apnea (adult) (pediatric): Secondary | ICD-10-CM

## 2019-05-03 DIAGNOSIS — I480 Paroxysmal atrial fibrillation: Secondary | ICD-10-CM | POA: Diagnosis not present

## 2019-05-03 DIAGNOSIS — I10 Essential (primary) hypertension: Secondary | ICD-10-CM | POA: Diagnosis not present

## 2019-05-03 MED ORDER — DILTIAZEM HCL ER COATED BEADS 120 MG PO CP24: 120.0000 mg | ORAL_CAPSULE | Freq: Every day | ORAL | 3 refills | Status: DC

## 2019-05-03 NOTE — Progress Notes (Signed)
Patient is here for follow up visit.  Subjective:    ID: Jose Cain, male    DOB: 1960/12/29, 58 y.o.   MRN: [id number]  Chief Complaint  Patient presents with  . Atrial Fibrillation    6 MONTH F/U    HPI  55 Caucasian male with hypertension, controlled type II diabetes mellitus, moderate obesity, OSA on CPAP, paroxysmal Afib/flutter s/p successful ablation by Dr. Johney Frame on 01/30/2018.  Patient last had a virtual visit with Dr. Johney Frame in June 2020, when he was symptomatically stable.  Given today, patient denies any symptoms of chest pain, shortness of breath, palpitations.  He is walking up to 30 minutes every day without any difficulty.  He has stable, unchanged, right greater than left lower extremity edema.  EKG today shows A. fib with RVR 159 bpm.  Of note, patient's galaxy smart watch notes his heart rate to be much lower in 70s.  Past Medical History:  Diagnosis Date  . Diabetes mellitus without complication (HCC)   . Hypertension   . Kidney stone   . Obesity   . Persistent atrial fibrillation      Past Surgical History:  Procedure Laterality Date  . ATRIAL FIBRILLATION ABLATION N/A 01/30/2018   Procedure: ATRIAL FIBRILLATION ABLATION;  Surgeon: [name], MD;  [facility];  Service: Cardiovascular;  Laterality: N/A;  . CARDIOVERSION N/A 12/12/2017   Procedure: CARDIOVERSION;  Surgeon: Elder Negus, MD;  Location: [facility] ENDOSCOPY;  Service: Cardiovascular;  Laterality: N/A;  . CARDIOVERSION N/A 01/08/2018   Procedure: CARDIOVERSION;  Surgeon: Elder Negus, MD;  Location: [facility]OS[facility];  Service: Cardiovascular;  Laterality: N/A;  . TEE WITHOUT CARDIOVERSION N/A 12/12/2017   Procedure: TRANSESOPHAGEAL ECHOCARDIOGRAM (TEE);  Surgeon: Elder Negus, MD;  Location: [facility] ENDOSCOPY;  Service: Cardiovascular;  Laterality: N/A;     Social History   Socioeconomic History  . Marital status: Married    Spouse name: Not on file   . Number of children: 3  . Years of education: Not on file  . Highest education level: Not on file  Occupational History  . Not on file  Social Needs  . Financial resource strain: Not on file  . Food insecurity    Worry: Not on file    Inability: Not on file  . Transportation needs    Medical: Not on file    Non-medical: Not on file  Tobacco Use  . Smoking status: Former Games developer  . Smokeless tobacco: Never Used  Substance and Sexual Activity  . Alcohol use: No  . Drug use: No  . Sexual activity: Not on file  Lifestyle  . Physical activity    Days per week: Not on file    Minutes per session: Not on file  . Stress: Not on file  Relationships  . Social Musician on phone: Not on file    Gets together: Not on file    Attends religious service: Not on file    Active member of club or organization: Not on file    Attends meetings of clubs or organizations: Not on file    Relationship status: Not on file  . Intimate partner violence    Fear of current or ex partner: Not on file    Emotionally abused: Not on file    Physically abused: Not on file    Forced sexual activity: Not on file  Other Topics Concern  . Not on file  Social History  Narrative  . Not on file     Current Outpatient Medications on File Prior to Visit  Medication Sig Dispense Refill  . cetirizine (ZYRTEC) 10 MG tablet Take 10 mg by mouth 2 (two) times daily.     . Empagliflozin-metFORMIN HCl 12-998 MG TABS Take 1 tablet by mouth 2 (two) times daily.    Marland Kitchen losartan (COZAAR) 100 MG tablet Take 50 mg by mouth at bedtime.   0  . metoprolol succinate (TOPROL-XL) 25 MG 24 hr tablet TAKE 1 TABLET BY MOUTH  DAILY 90 tablet 3  . Multiple Vitamin (MULTIVITAMIN WITH MINERALS) TABS tablet Take 1 tablet by mouth daily.    . Omega-3 Fatty Acids (FISH OIL) 1200 MG CAPS Take 2,400 mg by mouth 2 (two) times daily.    . rivaroxaban (XARELTO) 20 MG TABS tablet Take 20 mg by mouth at bedtime.      No current  facility-administered medications on file prior to visit.     Cardiovascular studies:  EKG 05/03/2019: Atrial fibrillation with rapid ventricular response 159 bpm. RBBB, LAFB.  Old inferior infarct.  Compared to previous EKG on 10/26/2018, Afib is new.   EKG 10/26/2018: Sinus tachycardia  Right bundle branch block with left axis -bifascicular block.  Inferior infarct -age undetermined.  No significant change compared to previous EKG in 07/2018.  Echocardiogram 04/12/2018: Left ventricle cavity is normal in size. Moderate concentric hypertrophy of the left ventricle. Normal diastolic filling pattern. Normal global wall motion. Normal LV systolic function. Calculated EF 55%. Compared to 12/29/2017, EF has improved from 35% and sevre LA enlargement is not noted now.  Ablation 01/30/2018: Atrial fibrillation and flutter ablation by Dr. [name]  CT cardiac morph/pulm vein 01/29/2018: No significant non-cardiac abnormality seen in visualized portion of the thorax.  EKG 01/17/2018: Atypical atrial flutter with 2:1 A-V conduction. Ventricular rate 118 bpm. ) Bundle branch block left anterior fascicular block. Lateral T wave inversions, cannot exclude ischemia. EKG 12/22/2017: Atrial fibrillation with rapid ventricular response 119 bpm. Right branch block, left anterior fascicular block. No ischemic changes. EKG 12/08/2017: Atypical atrial flutter with variable conduction. Average ventricular rate 140 bpm. Right bundle branch block, left anterior fascicular block. Nonspecific ST-T changes. Low-voltage. CHA2DS2VASc score 2: Annual stroke risk 2.2%  Cardioversion [01/08/2018]: Successful cardioversion 120 J X2  Echocardiogram TEE 12/12/2017: - Left ventricle: The estimated ejection fraction was in the range of 50% to 55%. - Mitral valve: There was mild to moderate regurgitation. - Left atrium: The atrium was moderately dilated. No evidence of thrombus in the atrial cavity or appendage. Acoustic  contrast opacification revealed no evidence ofthrombusin the atrial cavity or appendage. Emptying velocity was mildly reduced.  EKG 12/22/2017: Atrial fibrillation with rapid ventricular response 127 bpm. Right bundle branch block. Left anterior fascicular block. No iscehmic changes.  Sleep study xx/xx/20xx: positive for sleep apnea. On CPAP and compliant.    Recent labs: 02/16/2018: HbA1c 6.9%.  Lipid Profile Component Name Value Ref Range  LDL Direct 122 <150 mg/dL  Total Cholesterol 786 25 - 199 MG/DL  Triglycerides 767 (H) 10 - 150 MG/DL  HDL Cholesterol 31 (L) 35 - 135 MG/DL  Total Chol / HDL Cholesterol 4.9 (H) <=4.5   Non-HDL Cholesterol 121  MG/DL   Comprehensive Metabolic Panel  Component Name Value Ref Range  Sodium 139 135 - 146 MMOL/L  Potassium 4.3 3.5 - 5.3 MMOL/L  Chloride 104 98 - 110 MMOL/L  CO2 27 23 - 30 MMOL/L  BUN 19 8 -  24 MG/DL  Glucose 161181 (H) 70 - 99 MG/DL  Creatinine 0.960.63 0.5 - 1.5 MG/DL  Calcium 9.5 8.5 - 04.510.5 MG/DL  Total Protein 6.7 6 - 8.3 G/DL  Albumin  3.8 3.5 - 5 G/DL  Total Bilirubin 0.6 0.1 - 1.2 MG/DL  Alkaline Phosphatase 409111 25 - 125 IU/L  AST (SGOT) 25 5 - 40 IU/L  ALT (SGPT) 24 5 - 50 IU/L  Anion Gap 9 4 - 14 MMOL/L  Est. GFR Non-African American >=90  >=60 ML/MIN/1.73 M*2    CBC Latest Ref Rng & Units 01/06/2018  WBC 4.0 - 10.5 K/uL 8.0  Hemoglobin 13.0 - 17.0 g/dL 12.5(L)  Hematocrit 39.0 - 52.0 % 39.2  Platelets 150 - 400 K/uL 186     Review of Systems  Constitution: Negative for decreased appetite, malaise/fatigue, weight gain and weight loss.  HENT: Negative for congestion.   Eyes: Negative for visual disturbance.  Cardiovascular: Positive for leg swelling (Occasional leg swelling). Negative for chest pain, dyspnea on exertion (improved), palpitations and syncope.  Respiratory: Negative for shortness of breath.   Endocrine: Negative for cold intolerance.  Hematologic/Lymphatic: Does not bruise/bleed easily.  Skin:  Negative for itching and rash.  Musculoskeletal: Negative for myalgias.  Gastrointestinal: Negative for abdominal pain, nausea and vomiting.  Genitourinary: Negative for dysuria.  Neurological: Negative for dizziness and weakness.  Psychiatric/Behavioral: The patient is not nervous/anxious.   All other systems reviewed and are negative.      Objective:    Vitals:   05/03/19 0913  BP: 129/90  Pulse: 97  Temp: (!) 97.1 F (36.2 C)  SpO2: 98%     Physical Exam  Constitutional: He is oriented to person, place, and time. He appears well-developed and well-nourished. No distress.  Moderately Obese   HENT:  Head: Normocephalic and atraumatic.  Eyes: Pupils are equal, round, and reactive to light. Conjunctivae are normal.  Neck: No JVD present.  Cardiovascular: Intact distal pulses. An irregularly irregular rhythm present. Tachycardia present.  Pulmonary/Chest: Effort normal and breath sounds normal. He has no wheezes. He has no rales.  Abdominal: Soft. Bowel sounds are normal. There is no rebound.  Musculoskeletal:        General: Edema (Mild RLE edema) present.  Lymphadenopathy:    He has no cervical adenopathy.  Neurological: He is alert and oriented to person, place, and time. No cranial nerve deficit.  Skin: Skin is warm and dry.  Psychiatric: He has a normal mood and affect.  Nursing note and vitals reviewed.       Assessment & Recommendations:    [age] Caucasian male with hypertension, controlled type II diabetes mellitus, moderate obesity, OSA on CPAP, paroxysmal Afib/flutter s/p successful ablation by Dr. 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  Paroxysmal Afib/flutter: S/p successful ablation 03/2018. Patient is now in A. fib with RVR at 159 bpm, without any symptomatic recognition.  He will likely need evaluation for antiarrhythmic therapy versus repeat ablation.  I have started him on diltiazem 120 mg daily for rate  control.  Continue metoprolol succinate 25 mg daily.  I will request an office evaluation by Dr. Johney FrameAllred for further recommendations.   Patient does not have any baseline angina symptoms.  He has not had ischemic testing prior to this.  His EF had normalized to 55% after ablation.   CHA2DS2VAsc score 3 with h/o heart failure. Continue Xarelto 20 mg once daily.  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  EF of 55%.  Hypertension: Controlled.   F/u in 4 weeks.  Nigel Mormon, MD [facility]. [place] Pager: [phone] Office: [phone] If no answer Cell [phone]

## 2019-05-11 ENCOUNTER — Other Ambulatory Visit: Payer: Self-pay | Admitting: Cardiology

## 2019-05-13 ENCOUNTER — Telehealth: Payer: Self-pay

## 2019-05-13 NOTE — Telephone Encounter (Signed)
Spoke with pt regarding his appt on 05/17/19. Pt stated he did not have any questions at this time.

## 2019-05-17 ENCOUNTER — Telehealth: Payer: Self-pay

## 2019-05-17 ENCOUNTER — Telehealth (INDEPENDENT_AMBULATORY_CARE_PROVIDER_SITE_OTHER): Payer: 59 | Admitting: Internal Medicine

## 2019-05-17 ENCOUNTER — Encounter: Payer: Self-pay | Admitting: Internal Medicine

## 2019-05-17 VITALS — BP 128/89 | HR 99 | Ht 72.0 in | Wt 260.0 lb

## 2019-05-17 DIAGNOSIS — G4733 Obstructive sleep apnea (adult) (pediatric): Secondary | ICD-10-CM

## 2019-05-17 DIAGNOSIS — I10 Essential (primary) hypertension: Secondary | ICD-10-CM

## 2019-05-17 DIAGNOSIS — I480 Paroxysmal atrial fibrillation: Secondary | ICD-10-CM | POA: Diagnosis not present

## 2019-05-17 NOTE — Telephone Encounter (Signed)
-----   Message from Thompson Grayer, MD sent at 05/17/2019  9:17 AM EDT ----- Please schedule ILR implant in office.  He could do on Monday.

## 2019-05-17 NOTE — Progress Notes (Signed)
Electrophysiology TeleHealth Note   Due to national recommendations of social distancing due to COVID 19, an audio/video telehealth visit is felt to be most appropriate for this patient at this time.  See MyChart message from today for the patient's consent to telehealth for [facility].  Date:  05/17/2019   ID:  Jose Cain, DOB [birth year], MRN [id number]  Location: patient's home  Provider location:  [facility]  Evaluation Performed: Follow-up visit  PCP:  Nickola Major, MD   Electrophysiologist:  Dr Rayann Heman  Chief Complaint:  palpitations  History of Present Illness:    Jose Cain is a 58 y.o. male who presents via telehealth conferencing today.  Since last being seen in our clinic, the patient reports doing reasonably well.  He was recently documented to have afib with RVR by Dr Virgina Jock.  The patient was unaware.  Otherwise doing well.  Today, he denies symptoms of palpitations, chest pain, shortness of breath,  lower extremity edema, dizziness, presyncope, or syncope.  The patient is otherwise without complaint today.  The patient denies symptoms of fevers, chills, cough, or new SOB worrisome for COVID 19.  Past Medical History:  Diagnosis Date  . Diabetes mellitus without complication ([place])   . Hypertension   . Kidney stone   . Obesity   . Persistent atrial fibrillation [facility])     Past Surgical History:  Procedure Laterality Date  . ATRIAL FIBRILLATION ABLATION N/A 01/30/2018   Procedure: ATRIAL FIBRILLATION ABLATION;  Surgeon: Thompson Grayer, MD;  Location: [place] CV LAB;  Service: Cardiovascular;  Laterality: N/A;  . CARDIOVERSION N/A 12/12/2017   Procedure: CARDIOVERSION;  Surgeon: Nigel Mormon, MD;  Location: [place] ENDOSCOPY;  Service: Cardiovascular;  Laterality: N/A;  . CARDIOVERSION N/A 01/08/2018   Procedure: CARDIOVERSION;  Surgeon: Nigel Mormon, MD;  Location: [place] ENDOSCOPY;  Service: Cardiovascular;  Laterality: N/A;  . TEE  WITHOUT CARDIOVERSION N/A 12/12/2017   Procedure: TRANSESOPHAGEAL ECHOCARDIOGRAM (TEE);  Surgeon: Nigel Mormon, MD;  Location: [facility] ENDOSCOPY;  Service: Cardiovascular;  Laterality: N/A;    Current Outpatient Medications  Medication Sig Dispense Refill  . cetirizine (ZYRTEC) 10 MG tablet Take 10 mg by mouth 2 (two) times daily.     Marland Kitchen diltiazem (CARDIZEM CD) 120 MG 24 hr capsule Take 1 capsule (120 mg total) by mouth daily. 30 capsule 3  . Empagliflozin-metFORMIN HCl (SYNJARDY) 12-998 MG TABS Take 1 tablet by mouth 2 (two) times daily.    Marland Kitchen losartan (COZAAR) 100 MG tablet Take 50 mg by mouth at bedtime.   0  . metoprolol succinate (TOPROL-XL) 25 MG 24 hr tablet TAKE 1 TABLET BY MOUTH  DAILY 90 tablet 3  . Multiple Vitamin (MULTIVITAMIN WITH MINERALS) TABS tablet Take 1 tablet by mouth daily.    . Omega-3 Fatty Acids (FISH OIL) 1200 MG CAPS Take 2,400 mg by mouth 2 (two) times daily.    . sildenafil (VIAGRA) 50 MG tablet Take 50 mg by mouth as needed for erectile dysfunction.    Alveda Reasons 20 MG TABS tablet TAKE 1 TABLET BY MOUTH ONCE DAILY 90 tablet 3   No current facility-administered medications for this visit.     Allergies:   Penicillins   Social History:  The patient  reports that he quit smoking about 2 years ago. He has a 20.00 pack-year smoking history. He has never used smokeless tobacco. He reports that he does not drink alcohol or use drugs.   Family History:  The patient's  family history includes Coronary artery disease in his mother; Diabetes in his father; Heart disease in his mother.   ROS:  Please see the history of present illness.   All other systems are personally reviewed and negative.    Exam:    Vital Signs:  BP 128/89   Pulse 99   Ht 6' (1.829 m)   Wt 260 lb (117.9 kg)   BMI 35.26 kg/m   Well sounding and appearing, alert and conversant, regular work of breathing,  good skin color Eyes- anicteric, neuro- grossly intact, skin- no apparent rash or lesions  or cyanosis, mouth- oral mucosa is pink  Labs/Other Tests and Data Reviewed:    Recent Labs: No results found for requested labs within last 8760 hours.   Wt Readings from Last 3 Encounters:  05/17/19 260 lb (117.9 kg)  05/03/19 267 lb 14.4 oz (121.5 kg)  01/21/19 264 lb (119.7 kg)     ASSESSMENT & PLAN:    1.  Atrial fibrillation S/p prior ablation We discussed options of AAD therapy or repeat ablation.  He is not ready to proceed. He wonders what his overall afib burden is and how much his afib is impacting his quality of life.  I have advised implantation of a loop recorder.  I think that this will be necessary for me to better characterize his AF burden and for afib management post ablation going forward.  If his AF burden is high, then he will consider repeat ablation No changes today  2. Prior tachycardia mediated CM Will be important to maintain sinus long term  3. HTN Stable No change required today  4. Overweight Lifestyle modification is encouraged  5. OSA Compliant with CPAP  Follow-up:  I will arrange for ILR implant next week   Patient Risk:  after full review of this patients clinical status, I feel that they are at moderate risk at this time.  Today, I have spent 15 minutes with the patient with telehealth technology discussing arrhythmia management .    Randolm Idol, MD  05/17/2019 9:11 AM     [facility] [address] [place] [address] [phone] (office) [phone] (fax)

## 2019-05-17 NOTE — Telephone Encounter (Signed)
Call placed to Pt.  Pt scheduled for loop implant on 05/20/2019 at 2:15 pm.  Request sent to precert.  Notified Jill with MDT.

## 2019-05-20 ENCOUNTER — Other Ambulatory Visit: Payer: Self-pay

## 2019-05-20 ENCOUNTER — Ambulatory Visit (INDEPENDENT_AMBULATORY_CARE_PROVIDER_SITE_OTHER): Payer: 59 | Admitting: Internal Medicine

## 2019-05-20 ENCOUNTER — Encounter: Payer: Self-pay | Admitting: Internal Medicine

## 2019-05-20 VITALS — BP 112/80 | HR 117 | Ht 72.0 in | Wt 265.0 lb

## 2019-05-20 DIAGNOSIS — I480 Paroxysmal atrial fibrillation: Secondary | ICD-10-CM

## 2019-05-20 DIAGNOSIS — I10 Essential (primary) hypertension: Secondary | ICD-10-CM

## 2019-05-20 DIAGNOSIS — G4733 Obstructive sleep apnea (adult) (pediatric): Secondary | ICD-10-CM | POA: Diagnosis not present

## 2019-05-20 HISTORY — PX: OTHER SURGICAL HISTORY: SHX169

## 2019-05-20 MED ORDER — METOPROLOL SUCCINATE ER 50 MG PO TB24: 50.0000 mg | ORAL_TABLET | Freq: Every day | ORAL | 3 refills | Status: DC

## 2019-05-20 NOTE — Progress Notes (Signed)
PCP: Nickola Major, MD Primary Cardiologist: Dr Virgina Jock Primary EP: Dr Rayann Heman  Jose Cain is a 58 y.o. male who presents today for routine electrophysiology followup.  Since last being seen in our clinic, the patient reports doing very well.  He is unaware of his afib.  Today, he denies symptoms of palpitations, chest pain, shortness of breath,  lower extremity edema, dizziness, presyncope, or syncope.  The patient is otherwise without complaint today.   Past Medical History:  Diagnosis Date  . Diabetes mellitus without complication ([place])   . Hypertension   . Kidney stone   . Obesity   . Persistent atrial fibrillation [facility])    Past Surgical History:  Procedure Laterality Date  . ATRIAL FIBRILLATION ABLATION N/A 01/30/2018   Procedure: ATRIAL FIBRILLATION ABLATION;  Surgeon: Thompson Grayer, MD;  Location: [place] CV LAB;  Service: Cardiovascular;  Laterality: N/A;  . CARDIOVERSION N/A 12/12/2017   Procedure: CARDIOVERSION;  Surgeon: Nigel Mormon, MD;  Location: [place] ENDOSCOPY;  Service: Cardiovascular;  Laterality: N/A;  . CARDIOVERSION N/A 01/08/2018   Procedure: CARDIOVERSION;  Surgeon: Nigel Mormon, MD;  Location: [place] ENDOSCOPY;  Service: Cardiovascular;  Laterality: N/A;  . TEE WITHOUT CARDIOVERSION N/A 12/12/2017   Procedure: TRANSESOPHAGEAL ECHOCARDIOGRAM (TEE);  Surgeon: Nigel Mormon, MD;  Location: [facility] ENDOSCOPY;  Service: Cardiovascular;  Laterality: N/A;    ROS- all systems are reviewed and negatives except as per HPI above  Current Outpatient Medications  Medication Sig Dispense Refill  . cetirizine (ZYRTEC) 10 MG tablet Take 10 mg by mouth daily.     Marland Kitchen diltiazem (CARDIZEM CD) 120 MG 24 hr capsule Take 1 capsule (120 mg total) by mouth daily. 30 capsule 3  . Empagliflozin-metFORMIN HCl (SYNJARDY) 12-998 MG TABS Take 1 tablet by mouth 2 (two) times daily.    Marland Kitchen losartan (COZAAR) 50 MG tablet Take 50 mg by mouth daily.    . metoprolol succinate  (TOPROL-XL) 25 MG 24 hr tablet TAKE 1 TABLET BY MOUTH  DAILY 90 tablet 3  . Multiple Vitamin (MULTIVITAMIN WITH MINERALS) TABS tablet Take 1 tablet by mouth daily.    . Omega-3 Fatty Acids (FISH OIL) 1200 MG CAPS Take 2,400 mg by mouth 2 (two) times daily.    . sildenafil (VIAGRA) 50 MG tablet Take 50 mg by mouth as needed for erectile dysfunction.    Alveda Reasons 20 MG TABS tablet TAKE 1 TABLET BY MOUTH ONCE DAILY 90 tablet 3   No current facility-administered medications for this visit.     Physical Exam: Vitals:   05/20/19 1415  BP: 112/80  Pulse: (!) 117  SpO2: 98%  Weight: 265 lb (120.2 kg)  Height: 6' (1.829 m)    GEN- The patient is well appearing, alert and oriented x 3 today.   Head- normocephalic, atraumatic Eyes-  Sclera clear, conjunctiva pink Ears- hearing intact Oropharynx- clear Lungs-   normal work of breathing Limited exam due to COVID 19 restrictions  Wt Readings from Last 3 Encounters:  05/20/19 265 lb (120.2 kg)  05/17/19 260 lb (117.9 kg)  05/03/19 267 lb 14.4 oz (121.5 kg)    EKG tracing ordered today is personally reviewed and shows afib, RBBB, elevated V rates (117 bpm)  Assessment and Plan:  1. Persistent afib S/p prior ablation  Minimal symptoms with his afib I would advise long term monitoring with an ILR for afib management post ablation. Risks and benefits to ILR discussed with the patient who wishes to proceed. Increase  toprol to 50mg  daily He will see Dr in 2 weeks.  I would anticipate cardioversion if still in AF then. If he returns to afib thereafter, I would advise repeat ablation.  2. Prior tachycardia mediated CM Long term monitoring to manage af and reduce recurrence of CHF  3. HTN Stable No change required today  4. Compliant with CPAP for OSA  5. Weight loss advised for obesity  Return to see me 4 weeks after cardioversion  Rosemary Holms MD, [facility] 05/20/2019 2:30 PM     DESCRIPTION OF PROCEDURE:  Informed  written consent was obtained.  The patient required no sedation for the procedure today.  Mapping over the patient's chest was performed to identify the area where electrograms were most prominent for ILR recording.  This area was found to be the left parasternal region over the 3rd-4th intercostal space. The patients left chest was therefore prepped and draped in the usual sterile fashion. The skin overlying the left parasternal region was infiltrated with lidocaine for local analgesia.  A 0.5-cm incision was made over the left parasternal region over the 3rd intercostal space.  A subcutaneous ILR pocket was fashioned using a combination of sharp and blunt dissection.  A Medtronic Reveal Linq model 07/20/2019 (SN [id number]) implantable loop recorder was then placed into the pocket  R waves were very prominent and measured >0.84mV.  Steri- Strips and a sterile dressing were then applied.  There were no early apparent complications.     CONCLUSIONS:   1. Successful implantation of a Medtronic Reveal LINQ implantable loop recorder for evaluation of recurrent symptoms of atrial fibrillation post ablation  2. No early apparent complications.   3m MD, [facility] 05/20/2019 2:57 PM

## 2019-05-20 NOTE — Patient Instructions (Addendum)
Medication Instructions:  Your physician has recommended you make the following change in your medication:   1.  Increase your Toprol XL---Take 50 mg by mouth daily  You may take 2 tablets of your Toprol XL 25 mg until they are gone.  I have reordered Toprol XL 50 mg tablets with [facility].   Labwork: None ordered.  Testing/Procedures: None ordered.  Follow-Up: Your physician wants you to follow-up in:  November with Dr. Rayann Heman  Monthly remote monitoring   Any Other Special Instructions Will Be Listed Below (If Applicable).  If you need a refill on your cardiac medications before your next appointment, please call your pharmacy.    Implantable Loop Recorder Placement, Care After This sheet gives you information about how to care for yourself after your procedure. Your health care provider may also give you more specific instructions. If you have problems or questions, contact your health care provider. What can I expect after the procedure? After the procedure, it is common to have:  Soreness or discomfort near the incision.  Some swelling or bruising near the incision. Follow these instructions at home: Incision care   Follow instructions from your health care provider about how to take care of your incision. Make sure you: ? Wash your hands with soap and water before you change your bandage (dressing). If soap and water are not available, use hand sanitizer. ? Change your dressing as told by your health care provider. ? Keep your dressing dry. ? Leave stitches (sutures), skin glue, or adhesive strips in place. These skin closures may need to stay in place for 2 weeks or longer. If adhesive strip edges start to loosen and curl up, you may trim the loose edges. Do not remove adhesive strips completely unless your health care provider tells you to do that.  Check your incision area every day for signs of infection. Check for: ? Redness, swelling, or pain. ? Fluid or blood.  ? Warmth. ? Pus or a bad smell.  Do not take baths, swim, or use a hot tub until your health care provider approves. Ask your health care provider if you can take showers. Activity   Return to your normal activities as told by your health care provider. Ask your health care provider what activities are safe for you. General instructions  Follow instructions from your health care provider about how to manage your implantable loop recorder and transmit the information. Learn how to activate a recording if this is necessary for your type of device.  Do not go through a metal detection gate, and do not let someone hold a metal detector over your chest. Show your ID card.  Do not have an MRI unless you check with your health care provider first.  Take over-the-counter and prescription medicines only as told by your health care provider.  Keep all follow-up visits as told by your health care provider. This is important. Contact a health care provider if:  You have redness, swelling, or pain around your incision.  You have a fever.  You have pain that is not relieved by your pain medicine.  You have triggered your device because of fainting (syncope) or because of a heartbeat that feels like it is racing, slow, fluttering, or skipping (palpitations). Get help right away if you have:  Chest pain.  Difficulty breathing. Summary  After the procedure, it is common to have soreness or discomfort near the incision.  Change your dressing as told by your health care  provider.  Follow instructions from your health care provider about how to manage your implantable loop recorder and transmit the information.  Keep all follow-up visits as told by your health care provider. This is important. This information is not intended to replace advice given to you by your health care provider. Make sure you discuss any questions you have with your health care provider. Document Released: 07/06/2015  Document Revised: 09/09/2017 Document Reviewed: 09/09/2017 Elsevier Patient Education  2020 [employer].

## 2019-05-21 NOTE — Addendum Note (Signed)
Addended by: Rose Phi on: 05/21/2019 01:25 PM   Modules accepted: Orders

## 2019-05-24 ENCOUNTER — Telehealth: Payer: Self-pay | Admitting: Emergency Medicine

## 2019-05-24 NOTE — Telephone Encounter (Signed)
Patient unaware of episodes of AF with RVR that have occurred 10/15 and 05/24/19. Confirmed patient has increased his Toprol XL dose to 50 mf as ordered when he saw Dr Rayann Heman on 05/20/19. Patient is taking his Xarelto. Jose Cain

## 2019-05-31 NOTE — Progress Notes (Signed)
Patient is here for follow up visit.  Subjective:   @Patient  ID: Jose Cain, male    DOB: 10/22/1960, 58 y.o.   MRN: [id number]  Chief Complaint  Patient presents with  . Atrial Fibrillation  . Follow-up    4week    HPI  [id number] Caucasian male with hypertension, controlled type II diabetes mellitus, moderate obesity, OSA on CPAP, paroxysmal Afib/flutter s/p successful ablation by Dr. Johney FrameAllred on 01/30/2018.  Patient was reevaluated by Dr. Johney FrameAllred and underwent loop recorder placement to characterize his A. fib burden.  Patient remains in atrial fibrillation today. He has stable leg edema, but no other symptoms s/o heart failure.   Past Medical History:  Diagnosis Date  . Diabetes mellitus without complication (HCC)   . Hypertension   . Kidney stone   . Obesity   . Persistent atrial fibrillation [facility](HCC)      Past Surgical History:  Procedure Laterality Date  . ATRIAL FIBRILLATION ABLATION N/A 01/30/2018   Procedure: ATRIAL FIBRILLATION ABLATION;  Surgeon: Hillis RangeAllred, James, MD;  Location: [facility] INVASIVE CV LAB;  Service: Cardiovascular;  Laterality: N/A;  . CARDIOVERSION N/A 12/12/2017   Procedure: CARDIOVERSION;  Surgeon: Elder NegusPatwardhan, Laine Fonner J, MD;  Location: [facility] ENDOSCOPY;  Service: Cardiovascular;  Laterality: N/A;  . CARDIOVERSION N/A 01/08/2018   Procedure: CARDIOVERSION;  Surgeon: Elder NegusPatwardhan, Quincey Quesinberry J, MD;  Location: [facility] ENDOSCOPY;  Service: Cardiovascular;  Laterality: N/A;  . implantable loop recorder implantation  05/20/2019   Medtronic Reveal Linq model LNQ22 (SN [id number]) implantable loop recorder implanted by Dr Johney FrameAllred for afib management post ablation  . TEE WITHOUT CARDIOVERSION N/A 12/12/2017   Procedure: TRANSESOPHAGEAL ECHOCARDIOGRAM (TEE);  Surgeon: Elder NegusPatwardhan, Autym Siess J, MD;  [facility];  Service: Cardiovascular;  Laterality: N/A;     Social History   Socioeconomic History  . Marital status: Married    Spouse name: Not on file  . Number of children: 3  .  Years of education: Not on file  . Highest education level: Not on file  Occupational History  . Not on file  Social Needs  . Financial resource strain: Not on file  . Food insecurity    Worry: Not on file    Inability: Not on file  . Transportation needs    Medical: Not on file    Non-medical: Not on file  Tobacco Use  . Smoking status: Former Smoker    Packs/day: 1.00    Years: 20.00    Pack years: 20.00    Quit date: 03/2017    Years since quitting: 2.2  . Smokeless tobacco: Never Used  Substance and Sexual Activity  . Alcohol use: No  . Drug use: No  . Sexual activity: Not on file  Lifestyle  . Physical activity    Days per week: Not on file    Minutes per session: Not on file  . Stress: Not on file  Relationships  . Social Musicianconnections    Talks on phone: Not on file    Gets together: Not on file    Attends religious service: Not on file    Active member of club or organization: Not on file    Attends meetings of clubs or organizations: Not on file    Relationship status: Not on file  . Intimate partner violence    Fear of current or ex partner: Not on file    Emotionally abused: Not on file    Physically abused: Not on file    Forced sexual activity: Not  on file  Other Topics Concern  . Not on file  Social History Narrative  . Not on file     Current Outpatient Medications on File Prior to Visit  Medication Sig Dispense Refill  . cetirizine (ZYRTEC) 10 MG tablet Take 10 mg by mouth daily.     Marland Kitchen diltiazem (CARDIZEM CD) 120 MG 24 hr capsule Take 1 capsule (120 mg total) by mouth daily. 30 capsule 3  . Empagliflozin-metFORMIN HCl (SYNJARDY) 12-998 MG TABS Take 1 tablet by mouth 2 (two) times daily.    Marland Kitchen losartan (COZAAR) 50 MG tablet Take 50 mg by mouth daily.    . metoprolol succinate (TOPROL XL) 50 MG 24 hr tablet Take 1 tablet (50 mg total) by mouth daily. Take with or immediately following a meal. 90 tablet 3  . Multiple Vitamin (MULTIVITAMIN WITH  MINERALS) TABS tablet Take 1 tablet by mouth daily.    . Omega-3 Fatty Acids (FISH OIL) 1200 MG CAPS Take 2,400 mg by mouth 2 (two) times daily.    . sildenafil (VIAGRA) 50 MG tablet Take 50 mg by mouth as needed for erectile dysfunction.    Alveda Reasons 20 MG TABS tablet TAKE 1 TABLET BY MOUTH ONCE DAILY 90 tablet 3   No current facility-administered medications on file prior to visit.     Cardiovascular studies:   EKG 06/03/2019: Atrial fibrillation 141 bpm.  Diffuse low voltage.  Right bundle branch block.  Left posterior fascicular block. Diffuse nonspecific T-abnormality.   EKG 10/26/2018: Sinus tachycardia  Right bundle branch block with left axis -bifascicular block.  Inferior infarct -age undetermined.  No significant change compared to previous EKG in 07/2018.  Echocardiogram 04/12/2018: Left ventricle cavity is normal in size. Moderate concentric hypertrophy of the left ventricle. Normal diastolic filling pattern. Normal global wall motion. Normal LV systolic function. Calculated EF 55%. Compared to 12/29/2017, EF has improved from 35% and sevre LA enlargement is not noted now.  Ablation 01/30/2018: Atrial fibrillation and flutter ablation by Dr. Thompson Grayer  CT cardiac morph/pulm vein 01/29/2018: No significant non-cardiac abnormality seen in visualized portion of the thorax.  EKG 01/17/2018: Atypical atrial flutter with 2:1 A-V conduction. Ventricular rate 118 bpm. ) Bundle branch block left anterior fascicular block. Lateral T wave inversions, cannot exclude ischemia. EKG 12/22/2017: Atrial fibrillation with rapid ventricular response 119 bpm. Right branch block, left anterior fascicular block. No ischemic changes. EKG 12/08/2017: Atypical atrial flutter with variable conduction. Average ventricular rate 140 bpm. Right bundle branch block, left anterior fascicular block. Nonspecific ST-T changes. Low-voltage. CHA2DS2VASc score 2: Annual stroke risk 2.2%  Cardioversion  [01/08/2018]: Successful cardioversion 120 J X2  Echocardiogram TEE 12/12/2017: - Left ventricle: The estimated ejection fraction was in the range of 50% to 55%. - Mitral valve: There was mild to moderate regurgitation. - Left atrium: The atrium was moderately dilated. No evidence of thrombus in the atrial cavity or appendage. Acoustic contrast opacification revealed no evidence ofthrombusin the atrial cavity or appendage. Emptying velocity was mildly reduced.  EKG 12/22/2017: Atrial fibrillation with rapid ventricular response 127 bpm. Right bundle branch block. Left anterior fascicular block. No iscehmic changes.  Sleep study xx/xx/20xx: positive for sleep apnea. On CPAP and compliant.    Recent labs: 03/27/2019: Chol 170, TG 315, HDL 30, LDL 125  02/16/2018:  HbA1c 6.9%.     Review of Systems  Constitution: Negative for decreased appetite, malaise/fatigue, weight gain and weight loss.  HENT: Negative for congestion.   Eyes: Negative for visual  disturbance.  Cardiovascular: Positive for leg swelling (Occasional leg swelling). Negative for chest pain, dyspnea on exertion (improved), palpitations and syncope.  Respiratory: Negative for shortness of breath.   Endocrine: Negative for cold intolerance.  Hematologic/Lymphatic: Does not bruise/bleed easily.  Skin: Negative for itching and rash.  Musculoskeletal: Negative for myalgias.  Gastrointestinal: Negative for abdominal pain, nausea and vomiting.  Genitourinary: Negative for dysuria.  Neurological: Negative for dizziness and weakness.  Psychiatric/Behavioral: The patient is not nervous/anxious.   All other systems reviewed and are negative.      Objective:    Vitals:   06/03/19 0940  BP: 125/83  Pulse: 83  Temp: (!) 96.5 F (35.8 C)  SpO2: 99%     Physical Exam  Constitutional: He is oriented to person, place, and time. He appears well-developed and well-nourished. No distress.  Moderately Obese   HENT:  Head:  Normocephalic and atraumatic.  Eyes: Pupils are equal, round, and reactive to light. Conjunctivae are normal.  Neck: No JVD present.  Cardiovascular: Intact distal pulses. An irregularly irregular rhythm present. Tachycardia present.  Pulmonary/Chest: Effort normal and breath sounds normal. He has no wheezes. He has no rales.  Abdominal: Soft. Bowel sounds are normal. There is no rebound.  Musculoskeletal:        General: Edema (Mild RLE edema) present.  Lymphadenopathy:    He has no cervical adenopathy.  Neurological: He is alert and oriented to person, place, and time. No cranial nerve deficit.  Skin: Skin is warm and dry.  Psychiatric: He has a normal mood and affect.  Nursing note and vitals reviewed.       Assessment & Recommendations:    59 Caucasian male with hypertension, controlled type II diabetes mellitus, moderate obesity, OSA on CPAP, paroxysmal Afib/flutter s/p successful ablation by Dr. Johney Frame on 01/30/2018.  Paroxysmal Afib/flutter: S/p successful ablation 03/2018, Now back in A. fib.  Status post loop recorder placement by Dr. Johney Frame in 05/2019 to assess A. fib burden.  Given that he is persisted in atrial fibrillation, we will proceed with cardioversion.  He may need further ablation, defer this to Dr. Johney Frame.  Continue metoprolol succinate 25 mg daily, diltiazem 120 mg daily. Patient does not have any baseline angina symptoms.  He has not had ischemic testing prior to this.  His EF had normalized to 55% after ablation.   CHA2DS2VAsc score 3 with h/o heart failure. Continue Xarelto 20 mg once daily.  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 EF of 55%.  Dyslipidemia: Triglycerides elevated at 315 in the setting of LDL of 125.  Recommend starting Crestor 20 mg daily.  Repeat lipid panel and 3 months.  Hypertension: Controlled.    Elder Negus, MD [facility] Pager: [phone]  Office: [phone] If no answer Cell [phone]

## 2019-06-03 ENCOUNTER — Ambulatory Visit (INDEPENDENT_AMBULATORY_CARE_PROVIDER_SITE_OTHER): Payer: 59 | Admitting: Cardiology

## 2019-06-03 ENCOUNTER — Encounter: Payer: Self-pay | Admitting: Cardiology

## 2019-06-03 ENCOUNTER — Other Ambulatory Visit: Payer: Self-pay

## 2019-06-03 VITALS — BP 125/83 | HR 83 | Temp 96.5°F | Ht 73.0 in | Wt 270.0 lb

## 2019-06-03 DIAGNOSIS — E782 Mixed hyperlipidemia: Secondary | ICD-10-CM | POA: Diagnosis not present

## 2019-06-03 DIAGNOSIS — I10 Essential (primary) hypertension: Secondary | ICD-10-CM

## 2019-06-03 DIAGNOSIS — I480 Paroxysmal atrial fibrillation: Secondary | ICD-10-CM | POA: Diagnosis not present

## 2019-06-03 DIAGNOSIS — G4733 Obstructive sleep apnea (adult) (pediatric): Secondary | ICD-10-CM | POA: Diagnosis not present

## 2019-06-03 MED ORDER — ROSUVASTATIN CALCIUM 20 MG PO TABS: 20.0000 mg | ORAL_TABLET | Freq: Every day | ORAL | 3 refills | Status: DC

## 2019-06-04 LAB — BASIC METABOLIC PANEL
BUN/Creatinine Ratio: 14 (ref 9–20)
BUN: 12 mg/dL (ref 6–24)
CO2: 21 mmol/L (ref 20–29)
Calcium: 10.3 mg/dL — ABNORMAL HIGH (ref 8.7–10.2)
Chloride: 104 mmol/L (ref 96–106)
Creatinine, Ser: 0.87 mg/dL (ref 0.76–1.27)
GFR calc Af Amer: 111 mL/min/{1.73_m2} (ref >59)
GFR calc non Af Amer: 96 mL/min/{1.73_m2} (ref >59)
Glucose: 124 mg/dL — ABNORMAL HIGH (ref 65–99)
Potassium: 4.6 mmol/L (ref 3.5–5.2)
Sodium: 142 mmol/L (ref 134–144)

## 2019-06-07 ENCOUNTER — Other Ambulatory Visit (HOSPITAL_COMMUNITY)
Admission: RE | Admit: 2019-06-07 | Discharge: 2019-06-07 | Disposition: A | Discharge status: Home / Self Care | Payer: 59 | Source: Ambulatory Visit | Attending: Cardiology | Admitting: Cardiology

## 2019-06-07 DIAGNOSIS — Z01812 Encounter for preprocedural laboratory examination: Secondary | ICD-10-CM | POA: Diagnosis present

## 2019-06-07 DIAGNOSIS — Z20828 Contact with and (suspected) exposure to other viral communicable diseases: Secondary | ICD-10-CM | POA: Diagnosis not present

## 2019-06-08 LAB — NOVEL CORONAVIRUS, NAA (HOSP ORDER, SEND-OUT TO REF LAB; TAT 18-24 HRS): SARS-CoV-2, NAA: NOT DETECTED

## 2019-06-10 NOTE — Progress Notes (Signed)
Spoke with patient; has been quarantined and has no symptoms. Answered all questions.

## 2019-06-11 ENCOUNTER — Encounter (HOSPITAL_COMMUNITY)
Admission: RE | Disposition: A | Discharge status: Home / Self Care | Payer: Self-pay | Source: Home / Self Care | Attending: Cardiology

## 2019-06-11 ENCOUNTER — Telehealth: Payer: Self-pay

## 2019-06-11 ENCOUNTER — Ambulatory Visit (HOSPITAL_COMMUNITY)
Admission: RE | Admit: 2019-06-11 | Discharge: 2019-06-11 | Disposition: A | Discharge status: Home / Self Care | Payer: 59 | Attending: Cardiology | Admitting: Cardiology

## 2019-06-11 ENCOUNTER — Encounter (HOSPITAL_COMMUNITY): Payer: Self-pay | Admitting: *Deleted

## 2019-06-11 ENCOUNTER — Ambulatory Visit (HOSPITAL_COMMUNITY): Payer: 59 | Admitting: Anesthesiology

## 2019-06-11 ENCOUNTER — Other Ambulatory Visit: Payer: Self-pay

## 2019-06-11 DIAGNOSIS — E785 Hyperlipidemia, unspecified: Secondary | ICD-10-CM | POA: Insufficient documentation

## 2019-06-11 DIAGNOSIS — I10 Essential (primary) hypertension: Secondary | ICD-10-CM | POA: Diagnosis not present

## 2019-06-11 DIAGNOSIS — E119 Type 2 diabetes mellitus without complications: Secondary | ICD-10-CM | POA: Insufficient documentation

## 2019-06-11 DIAGNOSIS — Z87891 Personal history of nicotine dependence: Secondary | ICD-10-CM | POA: Insufficient documentation

## 2019-06-11 DIAGNOSIS — G4733 Obstructive sleep apnea (adult) (pediatric): Secondary | ICD-10-CM | POA: Insufficient documentation

## 2019-06-11 DIAGNOSIS — Z7901 Long term (current) use of anticoagulants: Secondary | ICD-10-CM | POA: Diagnosis not present

## 2019-06-11 DIAGNOSIS — I4891 Unspecified atrial fibrillation: Secondary | ICD-10-CM | POA: Diagnosis present

## 2019-06-11 DIAGNOSIS — I480 Paroxysmal atrial fibrillation: Secondary | ICD-10-CM | POA: Diagnosis not present

## 2019-06-11 DIAGNOSIS — Z79899 Other long term (current) drug therapy: Secondary | ICD-10-CM | POA: Diagnosis not present

## 2019-06-11 DIAGNOSIS — I429 Cardiomyopathy, unspecified: Secondary | ICD-10-CM | POA: Insufficient documentation

## 2019-06-11 HISTORY — PX: CARDIOVERSION: SHX1299

## 2019-06-11 LAB — GLUCOSE, CAPILLARY: Glucose-Capillary: 109 mg/dL — ABNORMAL HIGH (ref 70–99)

## 2019-06-11 SURGERY — CARDIOVERSION
Anesthesia: General

## 2019-06-11 MED ORDER — LIDOCAINE 2% (20 MG/ML) 5 ML SYRINGE
INTRAMUSCULAR | Status: DC | PRN
  Administered 2019-06-11: 80 mg via INTRAVENOUS

## 2019-06-11 MED ORDER — SODIUM CHLORIDE 0.9 % IV SOLN
INTRAVENOUS | Status: AC | PRN
  Administered 2019-06-11: 500 mL via INTRAVENOUS

## 2019-06-11 MED ORDER — PROPOFOL 10 MG/ML IV BOLUS
INTRAVENOUS | Status: DC | PRN
  Administered 2019-06-11: 100 mg via INTRAVENOUS
  Administered 2019-06-11: 30 mg via INTRAVENOUS

## 2019-06-11 NOTE — Transfer of Care (Signed)
Immediate Anesthesia Transfer of Care Note  Patient: Jose Cain  Procedure(s) Performed: CARDIOVERSION (N/A )  Patient Location: Endoscopy Unit  Anesthesia Type:General  Level of Consciousness: awake, alert  and oriented  Airway & Oxygen Therapy: Patient Spontanous Breathing  Post-op Assessment: Report given to RN and Post -op Vital signs reviewed and stable  Post vital signs: Reviewed and stable  Last Vitals:  Vitals Value Taken Time  BP 118/83 06/11/19 1335  Temp 36.8 C 06/11/19 1335  Pulse 87 06/11/19 1335  Resp 24 06/11/19 1335  SpO2      Last Pain:  Vitals:   06/11/19 1335  TempSrc: Oral  PainSc: 0-No pain         Complications: No apparent anesthesia complications

## 2019-06-11 NOTE — Discharge Instructions (Signed)

## 2019-06-11 NOTE — CV Procedure (Signed)
Direct current cardioversion:  Indication symptomatic: Symptomatic atrial fibrillation  Procedure: Under deep sedation administered and monitored by anesthesiology, synchronized direct current cardioversion performed. Patient was delivered with 120 Joules of electricity X 1 with success to NSR. Patient tolerated the procedure well. No immediate complication noted.   Adrea Sherpa J Carmin Alvidrez, MD [facility] Pager: [phone] Office: [phone] If no answer Cell [phone]    

## 2019-06-11 NOTE — Anesthesia Postprocedure Evaluation (Signed)
Anesthesia Post Note  Patient: Jose Cain  Procedure(s) Performed: CARDIOVERSION (N/A )     Patient location during evaluation: PACU Anesthesia Type: General Level of consciousness: awake and alert Pain management: pain level controlled Vital Signs Assessment: post-procedure vital signs reviewed and stable Respiratory status: spontaneous breathing, nonlabored ventilation and respiratory function stable Cardiovascular status: blood pressure returned to baseline and stable Postop Assessment: no apparent nausea or vomiting Anesthetic complications: no    Last Vitals:  Vitals:   06/11/19 1335 06/11/19 1350  BP: 118/83 122/78  Pulse: 87 84  Resp: (!) 24   Temp: 36.8 C   SpO2:  98%    Last Pain:  Vitals:   06/11/19 1350  TempSrc:   PainSc: 0-No pain                 Audry Pili

## 2019-06-11 NOTE — Anesthesia Procedure Notes (Signed)
Procedure Name: General with mask airway Date/Time: 06/11/2019 1:35 PM Performed by: Imagene Riches, CRNA Pre-anesthesia Checklist: Patient identified, Emergency Drugs available, Suction available and Patient being monitored Patient Re-evaluated:Patient Re-evaluated prior to induction Oxygen Delivery Method: Ambu bag

## 2019-06-11 NOTE — Telephone Encounter (Signed)
Per Dr. Virgina Jock informed pt to keep Dr. Rayann Heman appt and resch Dr. Virgina Jock until Feb. Pt understood.

## 2019-06-11 NOTE — Anesthesia Preprocedure Evaluation (Addendum)
Anesthesia Evaluation  Patient identified by MRN, date of birth, ID band Patient awake    Reviewed: Allergy & Precautions, NPO status , Patient's Chart, lab work & pertinent test results  History of Anesthesia Complications Negative for: history of anesthetic complications  Airway Mallampati: II  TM Distance: >3 FB Neck ROM: Full    Dental  (+) Dental Advisory Given   Pulmonary sleep apnea , former smoker,    Pulmonary exam normal        Cardiovascular hypertension, Pt. on medications and Pt. on home beta blockers + dysrhythmias Atrial Fibrillation  Rhythm:Irregular Rate:Normal   '19 TEE - EF 50% to 55%. Mild-mod MR. LA was moderately dilated.     Neuro/Psych negative neurological ROS  negative psych ROS   GI/Hepatic negative GI ROS, Neg liver ROS,   Endo/Other  diabetes Obesity   Renal/GU negative Renal ROS     Musculoskeletal negative musculoskeletal ROS (+)   Abdominal   Peds  Hematology negative hematology ROS (+)   Anesthesia Other Findings   Reproductive/Obstetrics                            Anesthesia Physical Anesthesia Plan  ASA: III  Anesthesia Plan: General   Post-op Pain Management:    Induction: Intravenous  PONV Risk Score and Plan: 2 and Treatment may vary due to age or medical condition and Propofol infusion  Airway Management Planned: Mask and Natural Airway  Additional Equipment: None  Intra-op Plan:   Post-operative Plan:   Informed Consent: I have reviewed the patients History and Physical, chart, labs and discussed the procedure including the risks, benefits and alternatives for the proposed anesthesia with the patient or authorized representative who has indicated his/her understanding and acceptance.       Plan Discussed with: CRNA and Anesthesiologist  Anesthesia Plan Comments:        Anesthesia Quick Evaluation

## 2019-06-11 NOTE — H&P (Signed)
OV 10/26 copied for documentation     Patient is here for follow up visit.  Subjective:   @Patient  ID: Jose Cain, male    DOB: 05/27/1961, 58 y.o.   MRN: [id number]  No chief complaint on file.   HPI  [id number] Caucasian male with hypertension, controlled type II diabetes mellitus, moderate obesity, OSA on CPAP, paroxysmal Afib/flutter s/p successful ablation by Dr. Johney FrameAllred on 01/30/2018.  Patient was reevaluated by Dr. Johney FrameAllred and underwent loop recorder placement to characterize his A. fib burden.  Patient remains in atrial fibrillation today. He has stable leg edema, but no other symptoms s/o heart failure.   Past Medical History:  Diagnosis Date  . Diabetes mellitus without complication (HCC)   . Hypertension   . Kidney stone   . Obesity   . Persistent atrial fibrillation [facility](HCC)      Past Surgical History:  Procedure Laterality Date  . ATRIAL FIBRILLATION ABLATION N/A 01/30/2018   Procedure: ATRIAL FIBRILLATION ABLATION;  Surgeon: Hillis RangeAllred, James, MD;  [facility];  Service: Cardiovascular;  Laterality: N/A;  . CARDIOVERSION N/A 12/12/2017   Procedure: CARDIOVERSION;  Surgeon: Elder NegusPatwardhan, Jaxsyn Azam J, MD;  Location: [facility]OS[facility];  Service: Cardiovascular;  Laterality: N/A;  . CARDIOVERSION N/A 01/08/2018   Procedure: CARDIOVERSION;  Surgeon: Elder NegusPatwardhan, Moritz Lever J, MD;  Location: [facility];  Service: Cardiovascular;  Laterality: N/A;  . implantable loop recorder implantation  05/20/2019   Medtronic Reveal Linq model LNQ22 (SN [id number]) implantable loop recorder implanted by Dr Johney FrameAllred for afib management post ablation  . TEE WITHOUT CARDIOVERSION N/A 12/12/2017   Procedure: TRANSESOPHAGEAL ECHOCARDIOGRAM (TEE);  Surgeon: Elder NegusPatwardhan, Maddon Horton J, MD;  [facility];  Service: Cardiovascular;  Laterality: N/A;     Social History   Socioeconomic History  . Marital status: Married    Spouse name: Not on file  . Number of children: 3  . Years of education: Not  on file  . Highest education level: Not on file  Occupational History  . Not on file  Social Needs  . Financial resource strain: Not on file  . Food insecurity    Worry: Not on file    Inability: Not on file  . Transportation needs    Medical: Not on file    Non-medical: Not on file  Tobacco Use  . Smoking status: Former Smoker    Packs/day: 1.00    Years: 20.00    Pack years: 20.00    Quit date: 03/2017    Years since quitting: 2.2  . Smokeless tobacco: Never Used  Substance and Sexual Activity  . Alcohol use: No  . Drug use: No  . Sexual activity: Not on file  Lifestyle  . Physical activity    Days per week: Not on file    Minutes per session: Not on file  . Stress: Not on file  Relationships  . Social Musicianconnections    Talks on phone: Not on file    Gets together: Not on file    Attends religious service: Not on file    Active member of club or organization: Not on file    Attends meetings of clubs or organizations: Not on file    Relationship status: Not on file  . Intimate partner violence    Fear of current or ex partner: Not on file    Emotionally abused: Not on file    Physically abused: Not on file    Forced sexual activity: Not on file  Other Topics Concern  .  Not on file  Social History Narrative  . Not on file     No current facility-administered medications on file prior to encounter.    Current Outpatient Medications on File Prior to Encounter  Medication Sig Dispense Refill  . cetirizine (ZYRTEC) 10 MG tablet Take 10 mg by mouth daily as needed for allergies.     Marland Kitchen diltiazem (CARDIZEM CD) 120 MG 24 hr capsule Take 1 capsule (120 mg total) by mouth daily. 30 capsule 3  . Empagliflozin-metFORMIN HCl (SYNJARDY) 12-998 MG TABS Take 1 tablet by mouth 2 (two) times daily.    . Flaxseed, Linseed, (FLAXSEED OIL PO) Take 2,800 mg by mouth 2 (two) times daily.    Marland Kitchen losartan (COZAAR) 50 MG tablet Take 50 mg by mouth at bedtime.     . metoprolol succinate  (TOPROL XL) 50 MG 24 hr tablet Take 1 tablet (50 mg total) by mouth daily. Take with or immediately following a meal. 90 tablet 3  . Multiple Vitamin (MULTIVITAMIN WITH MINERALS) TABS tablet Take 1 tablet by mouth daily.    . rosuvastatin (CRESTOR) 20 MG tablet Take 1 tablet (20 mg total) by mouth daily. 30 tablet 3  . sildenafil (VIAGRA) 50 MG tablet Take 50 mg by mouth as needed for erectile dysfunction.    Alveda Reasons 20 MG TABS tablet TAKE 1 TABLET BY MOUTH ONCE DAILY (Patient taking differently: Take 20 mg by mouth at bedtime. ) 90 tablet 3    Cardiovascular studies:   EKG 06/03/2019: Atrial fibrillation 141 bpm.  Diffuse low voltage.  Right bundle branch block.  Left posterior fascicular block. Diffuse nonspecific T-abnormality.   EKG 10/26/2018: Sinus tachycardia  Right bundle branch block with left axis -bifascicular block.  Inferior infarct -age undetermined.  No significant change compared to previous EKG in 07/2018.  Echocardiogram 04/12/2018: Left ventricle cavity is normal in size. Moderate concentric hypertrophy of the left ventricle. Normal diastolic filling pattern. Normal global wall motion. Normal LV systolic function. Calculated EF 55%. Compared to 12/29/2017, EF has improved from 35% and sevre LA enlargement is not noted now.  Ablation 01/30/2018: Atrial fibrillation and flutter ablation by Dr. Thompson Grayer  CT cardiac morph/pulm vein 01/29/2018: No significant non-cardiac abnormality seen in visualized portion of the thorax.  EKG 01/17/2018: Atypical atrial flutter with 2:1 A-V conduction. Ventricular rate 118 bpm. ) Bundle branch block left anterior fascicular block. Lateral T wave inversions, cannot exclude ischemia. EKG 12/22/2017: Atrial fibrillation with rapid ventricular response 119 bpm. Right branch block, left anterior fascicular block. No ischemic changes. EKG 12/08/2017: Atypical atrial flutter with variable conduction. Average ventricular rate 140 bpm.  Right bundle branch block, left anterior fascicular block. Nonspecific ST-T changes. Low-voltage. CHA2DS2VASc score 2: Annual stroke risk 2.2%  Cardioversion [01/08/2018]: Successful cardioversion 120 J X2  Echocardiogram TEE 12/12/2017: - Left ventricle: The estimated ejection fraction was in the range of 50% to 55%. - Mitral valve: There was mild to moderate regurgitation. - Left atrium: The atrium was moderately dilated. No evidence of thrombus in the atrial cavity or appendage. Acoustic contrast opacification revealed no evidence ofthrombusin the atrial cavity or appendage. Emptying velocity was mildly reduced.  EKG 12/22/2017: Atrial fibrillation with rapid ventricular response 127 bpm. Right bundle branch block. Left anterior fascicular block. No iscehmic changes.  Sleep study xx/xx/20xx: positive for sleep apnea. On CPAP and compliant.    Recent labs: 03/27/2019: Chol 170, TG 315, HDL 30, LDL 125  02/16/2018:  HbA1c 6.9%.     Review  of Systems  Constitution: Negative for decreased appetite, malaise/fatigue, weight gain and weight loss.  HENT: Negative for congestion.   Eyes: Negative for visual disturbance.  Cardiovascular: Positive for leg swelling (Occasional leg swelling). Negative for chest pain, dyspnea on exertion (improved), palpitations and syncope.  Respiratory: Negative for shortness of breath.   Endocrine: Negative for cold intolerance.  Hematologic/Lymphatic: Does not bruise/bleed easily.  Skin: Negative for itching and rash.  Musculoskeletal: Negative for myalgias.  Gastrointestinal: Negative for abdominal pain, nausea and vomiting.  Genitourinary: Negative for dysuria.  Neurological: Negative for dizziness and weakness.  Psychiatric/Behavioral: The patient is not nervous/anxious.   All other systems reviewed and are negative.      Objective:    Vitals:   06/11/19 1220  BP: (!) 149/102  Resp: 16  Temp: 97.9 F (36.6 C)  SpO2: 100%     Physical  Exam  Constitutional: He is oriented to person, place, and time. He appears well-developed and well-nourished. No distress.  Moderately Obese   HENT:  Head: Normocephalic and atraumatic.  Eyes: Pupils are equal, round, and reactive to light. Conjunctivae are normal.  Neck: No JVD present.  Cardiovascular: Intact distal pulses. An irregularly irregular rhythm present. Tachycardia present.  Pulmonary/Chest: Effort normal and breath sounds normal. He has no wheezes. He has no rales.  Abdominal: Soft. Bowel sounds are normal. There is no rebound.  Musculoskeletal:        General: Edema (Mild RLE edema) present.  Lymphadenopathy:    He has no cervical adenopathy.  Neurological: He is alert and oriented to person, place, and time. No cranial nerve deficit.  Skin: Skin is warm and dry.  Psychiatric: He has a normal mood and affect.  Nursing note and vitals reviewed.       Assessment & Recommendations:    20 Caucasian male with hypertension, controlled type II diabetes mellitus, moderate obesity, OSA on CPAP, paroxysmal Afib/flutter s/p successful ablation by Dr. Johney Frame on 01/30/2018.  Paroxysmal Afib/flutter: S/p successful ablation 03/2018, Now back in A. fib.  Status post loop recorder placement by Dr. Johney Frame in 05/2019 to assess A. fib burden.  Given that he is persisted in atrial fibrillation, we will proceed with cardioversion.  He may need further ablation, defer this to Dr. Johney Frame.  Continue metoprolol succinate 25 mg daily, diltiazem 120 mg daily. Patient does not have any baseline angina symptoms.  He has not had ischemic testing prior to this.  His EF had normalized to 55% after ablation.   CHA2DS2VAsc score 3 with h/o heart failure. Continue Xarelto 20 mg once daily.  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 EF of 55%.  Dyslipidemia: Triglycerides elevated at 315 in the setting of LDL of 125.  Recommend starting  Crestor 20 mg daily.  Repeat lipid panel and 3 months.  Hypertension: Controlled.    Elder Negus, MD [facility]. [place] Pager: [phone] Office: [phone] If no answer Cell [phone]

## 2019-06-11 NOTE — Interval H&P Note (Signed)
History and Physical Interval Note:  06/11/2019 12:38 PM  Jose Cain  has presented today for surgery, with the diagnosis of atrial fibrillation, hypertension.  The various methods of treatment have been discussed with the patient and family. After consideration of risks, benefits and other options for treatment, the patient has consented to  Procedure(s): CARDIOVERSION (N/A) as a surgical intervention.  The patient's history has been reviewed, patient examined, no change in status, stable for surgery.  I have reviewed the patient's chart and labs.  Questions were answered to the patient's satisfaction.     [place]

## 2019-06-12 ENCOUNTER — Encounter (HOSPITAL_COMMUNITY): Payer: Self-pay | Admitting: Cardiology

## 2019-06-12 ENCOUNTER — Telehealth: Payer: Self-pay | Admitting: *Deleted

## 2019-06-12 NOTE — Telephone Encounter (Signed)
LINQ alert received for electrical reset on 06/11/19. Discussed with [employer] tech services, likely related to DCCV on 06/11/19. Recommendation is to have pt come in for in-clinic device interrogation.  Spoke with patient. Explained findings. He is agreeable to a DC appointment on 06/13/19 at 11:00am. Aware to wear a mask and come by himself to appointment.

## 2019-06-13 ENCOUNTER — Ambulatory Visit (INDEPENDENT_AMBULATORY_CARE_PROVIDER_SITE_OTHER): Payer: 59 | Admitting: *Deleted

## 2019-06-13 ENCOUNTER — Other Ambulatory Visit: Payer: Self-pay

## 2019-06-13 DIAGNOSIS — I4819 Other persistent atrial fibrillation: Secondary | ICD-10-CM

## 2019-06-13 LAB — CUP PACEART REMOTE DEVICE CHECK
Date Time Interrogation Session: 20201105080027
Implantable Pulse Generator Implant Date: 20201012

## 2019-06-13 NOTE — Progress Notes (Signed)
Loop check in clinic. Battery status: Good. Alert received to reset loop after cardioversion on 06/11/19, ILR reset with industry present. R-waves 0.54mV. symptom episodes, 0 tachy episodes, 0 pause episodes, 0 brady episodes. 5 AF episodes (25.9% burden) that appear to be AF s/p cardioversion. + [place] . Monthly summary reports and F/U with Dr Rayann Heman on 06/21/19 .

## 2019-06-17 ENCOUNTER — Telehealth: Payer: 59 | Admitting: Internal Medicine

## 2019-06-21 ENCOUNTER — Telehealth: Payer: Self-pay | Admitting: Cardiology

## 2019-06-21 ENCOUNTER — Encounter: Payer: Self-pay | Admitting: Internal Medicine

## 2019-06-21 ENCOUNTER — Ambulatory Visit: Payer: 59 | Admitting: Cardiology

## 2019-06-21 ENCOUNTER — Telehealth (INDEPENDENT_AMBULATORY_CARE_PROVIDER_SITE_OTHER): Payer: 59 | Admitting: Internal Medicine

## 2019-06-21 VITALS — BP 118/87 | HR 140 | Ht 73.0 in | Wt 260.0 lb

## 2019-06-21 DIAGNOSIS — I10 Essential (primary) hypertension: Secondary | ICD-10-CM

## 2019-06-21 DIAGNOSIS — I480 Paroxysmal atrial fibrillation: Secondary | ICD-10-CM

## 2019-06-21 NOTE — Progress Notes (Signed)
Electrophysiology TeleHealth Note   Due to national recommendations of social distancing due to COVID 19, an audio/video telehealth visit is felt to be most appropriate for this patient at this time.  See MyChart message from today for the patient's consent to telehealth for [facility].  Date:  06/21/2019   ID:  [name], DOB Dec 29, 1960, MRN [id number]  Location: patient's home  Provider location:  [facility]  Evaluation Performed: Follow-up visit  PCP:  Gwenlyn Found, MD   Electrophysiologist:  Dr Johney Frame  Chief Complaint:  palpitations  History of Present Illness:    Rhyland Hinderliter is a 58 y.o. male who presents via telehealth conferencing today.  Since last being seen in our clinic, the patient reports doing reasonably well.  He was recently cardioverted but quickly returned to afib. + fatigue.  Today, he denies symptoms of palpitations, chest pain, shortness of breath,  lower extremity edema, dizziness, presyncope, or syncope.  The patient is otherwise without complaint today.  The patient denies symptoms of fevers, chills, cough, or new SOB worrisome for COVID 19.  Past Medical History:  Diagnosis Date  . Diabetes mellitus without complication (HCC)   . Hypertension   . Kidney stone   . Obesity   . Persistent atrial fibrillation [facility])     Past Surgical History:  Procedure Laterality Date  . ATRIAL FIBRILLATION ABLATION N/A 01/30/2018   Procedure: ATRIAL FIBRILLATION ABLATION;  Surgeon: [name], MD;  [facility];  Service: Cardiovascular;  Laterality: N/A;  . CARDIOVERSION N/A 12/12/2017   Procedure: CARDIOVERSION;  Surgeon: Elder Negus, MD;  Location: [facility] ENDOSCOPY;  Service: Cardiovascular;  Laterality: N/A;  . CARDIOVERSION N/A 01/08/2018   Procedure: CARDIOVERSION;  Surgeon: Elder Negus, MD;  Location: [facility] ENDOS[facility];  Service: Cardiovascular;  Laterality: N/A;  . CARDIOVERSION N/A 06/11/2019   Procedure:  CARDIOVERSION;  Surgeon: Elder Negus, MD;  Location: [facility] ENDOSCOPY;  Service: Cardiovascular;  Laterality: N/A;  . implantable loop recorder implantation  05/20/2019   [employer]tr[employer] Reveal Linq model LNQ22 (SN [id number]) implantable loop recorder implanted by Dr Johney Frame for afib management post ablation  . TEE WITHOUT CARDIOVERSION N/A 12/12/2017   Procedure: TRANSESOPHAGEAL ECHOCARDIOGRAM (TEE);  Surgeon: Elder Negus, MD;  Location: [facility] ENDOSCOPY;  Service: Cardiovascular;  Laterality: N/A;    Current Outpatient Medications  Medication Sig Dispense Refill  . cetirizine (ZYRTEC) 10 MG tablet Take 10 mg by mouth daily as needed for allergies.     Marland Kitchen diltiazem (CARDIZEM CD) 120 MG 24 hr capsule Take 1 capsule (120 mg total) by mouth daily. 30 capsule 3  . Empagliflozin-metFORMIN HCl (SYNJARDY) 12-998 MG TABS Take 1 tablet by mouth 2 (two) times daily.    . Flaxseed, Linseed, (FLAXSEED OIL PO) Take 2,800 mg by mouth 2 (two) times daily.    Marland Kitchen losartan (COZAAR) 50 MG tablet Take 50 mg by mouth at bedtime.     . metoprolol succinate (TOPROL XL) 50 MG 24 hr tablet Take 1 tablet (50 mg total) by mouth daily. Take with or immediately following a meal. 90 tablet 3  . Multiple Vitamin (MULTIVITAMIN WITH MINERALS) TABS tablet Take 1 tablet by mouth daily.    . rosuvastatin (CRESTOR) 20 MG tablet Take 1 tablet (20 mg total) by mouth daily. 30 tablet 3  . sildenafil (VIAGRA) 50 MG tablet Take 50 mg by mouth as needed for erectile dysfunction.    [name] 20 MG TABS tablet TAKE 1 TABLET BY  MOUTH ONCE DAILY (Patient taking differently: Take 20 mg by mouth at bedtime. ) 90 tablet 3   No current facility-administered medications for this visit.     Allergies:   Penicillins   Social History:  The patient  reports that he quit smoking about 2 years ago. He has a 20.00 pack-year smoking history. He has never used smokeless tobacco. He reports that he does not drink alcohol or use drugs.   Family  History:  The patient's family history includes Coronary artery disease in his mother; Diabetes in his father; Heart disease in his mother.   ROS:  Please see the history of present illness.   All other systems are personally reviewed and negative.    Exam:    Vital Signs:  BP 118/87   Pulse (!) 140   Ht 6\' 1"  (1.854 m)   Wt 260 lb (117.9 kg)   BMI 34.30 kg/m   Well sounding and appearing, alert and conversant, regular work of breathing,  good skin color Eyes- anicteric, neuro- grossly intact, skin- no apparent rash or lesions or cyanosis, mouth- oral mucosa is pink  Labs/Other Tests and Data Reviewed:    Recent Labs: 06/03/2019: BUN 12; Creatinine, Ser 0.87; Potassium 4.6; Sodium 142   Wt Readings from Last 3 Encounters:  06/21/19 260 lb (117.9 kg)  06/11/19 270 lb (122.5 kg)  06/03/19 270 lb (122.5 kg)     Last device remote is reviewed from [facility] PDF which reveals afib by ILR TEE 2019 reviewed  ASSESSMENT & PLAN:    1.  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  Therapeutic strategies for afib including medicine (amiodarone) and ablation were discussed in detail with the patient today. Risk, benefits, and alternatives to EP study and radiofrequency ablation for afib were also discussed in detail today. These risks include but are not limited to stroke, bleeding, vascular damage, tamponade, perforation, damage to the esophagus, lungs, and other structures, pulmonary vein stenosis, worsening renal function, and death.  I will discuss with Dr Virgina Jock.  Though I would like to have an echo prior to ablation, I worry that given RVR this may not be helpful.  Ultimately, given LA enlargement and persistent afib, we may need to think about surgical strategies for his afib.  Dr Virgina Jock will follow-up with th patient and we will then make a decision  together of how to proceed.  2. HTN Stable No change required today  3. OSA Compliance with therapy is advised   Patient Risk:  after full review of this patients clinical status, I feel that they are at high risk at this time.  Today, I have spent 20 minutes with the patient with telehealth technology discussing arrhythmia management .    Army Fossa, MD  06/21/2019 8:56 AM     [facility] [address] [place] [address] [phone] (office) [phone] (fax)

## 2019-06-21 NOTE — Telephone Encounter (Signed)
Patient is back in atrial fibrillation with RVR, as noted on loop recorder.  I long discussion with Dr. Rayann Heman, as well as the patient.  Dr. Rayann Heman requested echocardiogram to assess left atrial size, EF, and any mitral regurgitation before making a decision regarding repeat PVI, or surgical ablation.  I think this is a reasonable option, provided we can control the heart rate adequately enough to get better information from echocardiogram.  We will plan on performing outpatient echocardiogram, with additional doses of diltiazem, as needed for rate control.  Based on echocardiogram, Dr. Rayann Heman will discuss with the patient regarding repeat PVI, or surgical ablation/MMaze procedure.  Nigel Mormon, MD [facility]. [place] Pager: [phone] Office: [phone]

## 2019-06-21 NOTE — Addendum Note (Signed)
Addended by: Nigel Mormon on: 06/21/2019 11:44 AM   Modules accepted: Orders

## 2019-06-21 NOTE — Telephone Encounter (Signed)
Preferably, within next 1-2 weeks.  Please instruct the patient to take metoprolol succinate 50 mg daily, and diltiazem 240 mg (instead of regular dose of 120 mg), and bring these two medications with him to the visit. Please ask Rise Paganini to check with me before the echocardiogram.  Please get volumetric measurements for left atrium.

## 2019-06-24 ENCOUNTER — Ambulatory Visit (INDEPENDENT_AMBULATORY_CARE_PROVIDER_SITE_OTHER): Payer: 59 | Admitting: *Deleted

## 2019-06-24 DIAGNOSIS — I4891 Unspecified atrial fibrillation: Secondary | ICD-10-CM

## 2019-06-24 DIAGNOSIS — I4892 Unspecified atrial flutter: Secondary | ICD-10-CM | POA: Diagnosis not present

## 2019-06-25 LAB — CUP PACEART REMOTE DEVICE CHECK
Date Time Interrogation Session: 20201116074501
Implantable Pulse Generator Implant Date: 20201012

## 2019-06-26 ENCOUNTER — Other Ambulatory Visit: Payer: Self-pay

## 2019-06-26 DIAGNOSIS — E782 Mixed hyperlipidemia: Secondary | ICD-10-CM

## 2019-06-26 MED ORDER — ROSUVASTATIN CALCIUM 20 MG PO TABS: 20.0000 mg | ORAL_TABLET | Freq: Every day | ORAL | 1 refills | Status: DC

## 2019-06-28 ENCOUNTER — Ambulatory Visit (INDEPENDENT_AMBULATORY_CARE_PROVIDER_SITE_OTHER): Payer: 59

## 2019-06-28 ENCOUNTER — Other Ambulatory Visit: Payer: Self-pay

## 2019-06-28 DIAGNOSIS — I480 Paroxysmal atrial fibrillation: Secondary | ICD-10-CM | POA: Diagnosis not present

## 2019-07-10 ENCOUNTER — Telehealth: Payer: Self-pay

## 2019-07-10 DIAGNOSIS — I4819 Other persistent atrial fibrillation: Secondary | ICD-10-CM

## 2019-07-10 NOTE — Telephone Encounter (Signed)
-----   Message from Thompson Grayer, MD sent at 07/09/2019  7:45 AM EST ----- Please call Mr Jose Cain and offer him repeat afib ablation with me.   ----- Message ----- From: Nigel Mormon, MD Sent: 07/06/2019  12:08 PM EST To: Thompson Grayer, MD  I think its very reasonable to do so. Also, he did not seem to keen on surgical option, when I spoke with him.

## 2019-07-15 NOTE — Telephone Encounter (Signed)
Pt scheduled for afib ablation on December 15.  All labs/covid test and cardiac CT scheduled.  Instruction letters loaded to mychart.   Work up complete.

## 2019-07-18 ENCOUNTER — Other Ambulatory Visit: Payer: 59 | Admitting: *Deleted

## 2019-07-18 ENCOUNTER — Other Ambulatory Visit: Payer: Self-pay

## 2019-07-18 ENCOUNTER — Telehealth (HOSPITAL_COMMUNITY): Payer: Self-pay | Admitting: Emergency Medicine

## 2019-07-18 DIAGNOSIS — I4819 Other persistent atrial fibrillation: Secondary | ICD-10-CM

## 2019-07-18 NOTE — Telephone Encounter (Signed)
Reaching out to patient to offer assistance regarding upcoming cardiac imaging study; pt verbalizes understanding of appt date/time, parking situation and where to check in, pre-test NPO status and medications ordered, and verified current allergies; name and call back number provided for further questions should they arise Ranveer Wahlstrom RN Navigator Cardiac Imaging [facility] [phone] office [phone] cell 

## 2019-07-18 NOTE — Progress Notes (Signed)
Carelink Summary Report / Loop Recorder 

## 2019-07-19 ENCOUNTER — Ambulatory Visit (HOSPITAL_COMMUNITY)
Admission: RE | Admit: 2019-07-19 | Discharge: 2019-07-19 | Disposition: A | Discharge status: Home / Self Care | Payer: 59 | Source: Ambulatory Visit | Attending: Internal Medicine | Admitting: Internal Medicine

## 2019-07-19 DIAGNOSIS — I4819 Other persistent atrial fibrillation: Secondary | ICD-10-CM

## 2019-07-19 LAB — BASIC METABOLIC PANEL
BUN/Creatinine Ratio: 19 (ref 9–20)
BUN: 16 mg/dL (ref 6–24)
CO2: 21 mmol/L (ref 20–29)
Calcium: 10.3 mg/dL — ABNORMAL HIGH (ref 8.7–10.2)
Chloride: 99 mmol/L (ref 96–106)
Creatinine, Ser: 0.83 mg/dL (ref 0.76–1.27)
GFR calc Af Amer: 113 mL/min/{1.73_m2} (ref >59)
GFR calc non Af Amer: 98 mL/min/{1.73_m2} (ref >59)
Glucose: 131 mg/dL — ABNORMAL HIGH (ref 65–99)
Potassium: 4.2 mmol/L (ref 3.5–5.2)
Sodium: 137 mmol/L (ref 134–144)

## 2019-07-19 LAB — CBC WITH DIFFERENTIAL/PLATELET
Basophils Absolute: 0.1 10*3/uL (ref 0.0–0.2)
Basos: 1 %
EOS (ABSOLUTE): 0.2 10*3/uL (ref 0.0–0.4)
Eos: 2 %
Hematocrit: 46.2 % (ref 37.5–51.0)
Hemoglobin: 16.2 g/dL (ref 13.0–17.7)
Immature Grans (Abs): 0 10*3/uL (ref 0.0–0.1)
Immature Granulocytes: 0 %
Lymphocytes Absolute: 2.8 10*3/uL (ref 0.7–3.1)
Lymphs: 31 %
MCH: 32.9 pg (ref 26.6–33.0)
MCHC: 35.1 g/dL (ref 31.5–35.7)
MCV: 94 fL (ref 79–97)
Monocytes Absolute: 0.7 10*3/uL (ref 0.1–0.9)
Monocytes: 8 %
Neutrophils Absolute: 5 10*3/uL (ref 1.4–7.0)
Neutrophils: 58 %
Platelets: 247 10*3/uL (ref 150–450)
RBC: 4.92 x10E6/uL (ref 4.14–5.80)
RDW: 13.6 % (ref 11.6–15.4)
WBC: 8.9 10*3/uL (ref 3.4–10.8)

## 2019-07-19 MED ORDER — METOPROLOL TARTRATE 5 MG/5ML IV SOLN
INTRAVENOUS | Status: AC
  Filled 2019-07-19: qty 15

## 2019-07-19 MED ORDER — METOPROLOL TARTRATE 5 MG/5ML IV SOLN
5.0000 mg | INTRAVENOUS | Status: DC | PRN
  Administered 2019-07-19: 5 mg via INTRAVENOUS

## 2019-07-19 MED ORDER — IOHEXOL 350 MG/ML SOLN
100.0000 mL | Freq: Once | INTRAVENOUS | Status: AC | PRN
  Administered 2019-07-19: 100 mL via INTRAVENOUS

## 2019-07-20 ENCOUNTER — Other Ambulatory Visit (HOSPITAL_COMMUNITY)
Admission: RE | Admit: 2019-07-20 | Discharge: 2019-07-20 | Disposition: A | Discharge status: Home / Self Care | Payer: 59 | Source: Ambulatory Visit | Attending: Internal Medicine | Admitting: Internal Medicine

## 2019-07-20 DIAGNOSIS — Z20828 Contact with and (suspected) exposure to other viral communicable diseases: Secondary | ICD-10-CM | POA: Insufficient documentation

## 2019-07-20 DIAGNOSIS — Z01812 Encounter for preprocedural laboratory examination: Secondary | ICD-10-CM | POA: Insufficient documentation

## 2019-07-20 LAB — SARS CORONAVIRUS 2 (TAT 6-24 HRS): SARS Coronavirus 2: NEGATIVE

## 2019-07-22 NOTE — Anesthesia Preprocedure Evaluation (Addendum)
Anesthesia Evaluation  Patient identified by MRN, date of birth, ID band Patient awake    Reviewed: Allergy & Precautions, NPO status , Patient's Chart, lab work & pertinent test results  History of Anesthesia Complications Negative for: history of anesthetic complications  Airway Mallampati: II  TM Distance: >3 FB Neck ROM: Full    Dental  (+) Teeth Intact   Pulmonary sleep apnea , former smoker,    Pulmonary exam normal        Cardiovascular hypertension, Pt. on medications and Pt. on home beta blockers Normal cardiovascular exam+ dysrhythmias Atrial Fibrillation      Neuro/Psych negative neurological ROS  negative psych ROS   GI/Hepatic negative GI ROS, Neg liver ROS,   Endo/Other  diabetes  Renal/GU negative Renal ROS  negative genitourinary   Musculoskeletal negative musculoskeletal ROS (+)   Abdominal   Peds  Hematology negative hematology ROS (+)   Anesthesia Other Findings Echo 12/12/17: EF 50-55%, mild-mod MR  Reproductive/Obstetrics                            Anesthesia Physical Anesthesia Plan  ASA: III  Anesthesia Plan: General   Post-op Pain Management:    Induction: Intravenous  PONV Risk Score and Plan: 2 and Ondansetron, Dexamethasone, Treatment may vary due to age or medical condition and Midazolam  Airway Management Planned: Oral ETT  Additional Equipment: None  Intra-op Plan:   Post-operative Plan: Extubation in OR  Informed Consent: I have reviewed the patients History and Physical, chart, labs and discussed the procedure including the risks, benefits and alternatives for the proposed anesthesia with the patient or authorized representative who has indicated his/her understanding and acceptance.     Dental advisory given  Plan Discussed with:   Anesthesia Plan Comments:        Anesthesia Quick Evaluation

## 2019-07-23 ENCOUNTER — Encounter (HOSPITAL_COMMUNITY): Payer: Self-pay | Admitting: Internal Medicine

## 2019-07-23 ENCOUNTER — Encounter (HOSPITAL_COMMUNITY)
Admission: RE | Disposition: A | Discharge status: Home / Self Care | Payer: 59 | Source: Home / Self Care | Attending: Internal Medicine

## 2019-07-23 ENCOUNTER — Other Ambulatory Visit: Payer: Self-pay

## 2019-07-23 ENCOUNTER — Ambulatory Visit (HOSPITAL_COMMUNITY): Payer: 59 | Admitting: Anesthesiology

## 2019-07-23 ENCOUNTER — Ambulatory Visit (HOSPITAL_COMMUNITY)
Admission: RE | Admit: 2019-07-23 | Discharge: 2019-07-23 | Disposition: A | Discharge status: Home / Self Care | Payer: 59 | Attending: Internal Medicine | Admitting: Internal Medicine

## 2019-07-23 DIAGNOSIS — Z6835 Body mass index (BMI) 35.0-35.9, adult: Secondary | ICD-10-CM | POA: Insufficient documentation

## 2019-07-23 DIAGNOSIS — I4819 Other persistent atrial fibrillation: Secondary | ICD-10-CM | POA: Insufficient documentation

## 2019-07-23 DIAGNOSIS — E669 Obesity, unspecified: Secondary | ICD-10-CM | POA: Diagnosis not present

## 2019-07-23 DIAGNOSIS — Z880 Allergy status to penicillin: Secondary | ICD-10-CM | POA: Insufficient documentation

## 2019-07-23 DIAGNOSIS — Z7901 Long term (current) use of anticoagulants: Secondary | ICD-10-CM | POA: Insufficient documentation

## 2019-07-23 DIAGNOSIS — Z7984 Long term (current) use of oral hypoglycemic drugs: Secondary | ICD-10-CM | POA: Insufficient documentation

## 2019-07-23 DIAGNOSIS — I10 Essential (primary) hypertension: Secondary | ICD-10-CM | POA: Insufficient documentation

## 2019-07-23 DIAGNOSIS — Z87891 Personal history of nicotine dependence: Secondary | ICD-10-CM | POA: Diagnosis not present

## 2019-07-23 DIAGNOSIS — E119 Type 2 diabetes mellitus without complications: Secondary | ICD-10-CM | POA: Insufficient documentation

## 2019-07-23 DIAGNOSIS — Z79899 Other long term (current) drug therapy: Secondary | ICD-10-CM | POA: Diagnosis not present

## 2019-07-23 HISTORY — PX: ATRIAL FIBRILLATION ABLATION: EP1191

## 2019-07-23 LAB — GLUCOSE, CAPILLARY
Glucose-Capillary: 153 mg/dL — ABNORMAL HIGH (ref 70–99)
Glucose-Capillary: 137 mg/dL — ABNORMAL HIGH (ref 70–99)

## 2019-07-23 LAB — POCT ACTIVATED CLOTTING TIME
Activated Clotting Time: 230 seconds
Activated Clotting Time: 257 seconds

## 2019-07-23 SURGERY — ATRIAL FIBRILLATION ABLATION
Anesthesia: General

## 2019-07-23 MED ORDER — SODIUM CHLORIDE 0.9 % IV SOLN: INTRAVENOUS | Status: DC

## 2019-07-23 MED ORDER — MIDAZOLAM HCL 2 MG/2ML IJ SOLN
INTRAMUSCULAR | Status: DC | PRN
  Administered 2019-07-23: 2 mg via INTRAVENOUS

## 2019-07-23 MED ORDER — ONDANSETRON HCL 4 MG/2ML IJ SOLN
INTRAMUSCULAR | Status: DC | PRN
  Administered 2019-07-23: 4 mg via INTRAVENOUS

## 2019-07-23 MED ORDER — SODIUM CHLORIDE 0.9% FLUSH: 3.0000 mL | Freq: Two times a day (BID) | INTRAVENOUS | Status: DC

## 2019-07-23 MED ORDER — HEPARIN SODIUM (PORCINE) 1000 UNIT/ML IJ SOLN
INTRAMUSCULAR | Status: DC | PRN
  Administered 2019-07-23: 14000 [IU] via INTRAVENOUS
  Administered 2019-07-23: 1000 [IU] via INTRAVENOUS

## 2019-07-23 MED ORDER — DEXAMETHASONE SODIUM PHOSPHATE 10 MG/ML IJ SOLN
INTRAMUSCULAR | Status: DC | PRN
  Administered 2019-07-23: 5 mg via INTRAVENOUS

## 2019-07-23 MED ORDER — LIDOCAINE 2% (20 MG/ML) 5 ML SYRINGE
INTRAMUSCULAR | Status: DC | PRN
  Administered 2019-07-23: 100 mg via INTRAVENOUS

## 2019-07-23 MED ORDER — SODIUM CHLORIDE 0.9% FLUSH: 3.0000 mL | INTRAVENOUS | Status: DC | PRN

## 2019-07-23 MED ORDER — SUGAMMADEX SODIUM 200 MG/2ML IV SOLN
INTRAVENOUS | Status: DC | PRN
  Administered 2019-07-23: 230 mg via INTRAVENOUS

## 2019-07-23 MED ORDER — PROPOFOL 10 MG/ML IV BOLUS
INTRAVENOUS | Status: DC | PRN
  Administered 2019-07-23: 200 mg via INTRAVENOUS

## 2019-07-23 MED ORDER — HEPARIN SODIUM (PORCINE) 1000 UNIT/ML IJ SOLN
INTRAMUSCULAR | Status: DC | PRN
  Administered 2019-07-23: 5000 [IU] via INTRAVENOUS

## 2019-07-23 MED ORDER — HYDROCODONE-ACETAMINOPHEN 5-325 MG PO TABS: 1.0000 | ORAL_TABLET | ORAL | Status: DC | PRN

## 2019-07-23 MED ORDER — ONDANSETRON HCL 4 MG/2ML IJ SOLN: 4.0000 mg | Freq: Four times a day (QID) | INTRAMUSCULAR | Status: DC | PRN

## 2019-07-23 MED ORDER — PANTOPRAZOLE SODIUM 40 MG PO TBEC: 40.0000 mg | DELAYED_RELEASE_TABLET | Freq: Every day | ORAL | 0 refills | Status: DC

## 2019-07-23 MED ORDER — HEPARIN (PORCINE) IN NACL 1000-0.9 UT/500ML-% IV SOLN
INTRAVENOUS | Status: DC | PRN
  Administered 2019-07-23: 500 mL

## 2019-07-23 MED ORDER — ROCURONIUM BROMIDE 10 MG/ML (PF) SYRINGE
PREFILLED_SYRINGE | INTRAVENOUS | Status: DC | PRN
  Administered 2019-07-23: 100 mg via INTRAVENOUS
  Administered 2019-07-23 (×2): 20 mg via INTRAVENOUS

## 2019-07-23 MED ORDER — HEPARIN (PORCINE) IN NACL 1000-0.9 UT/500ML-% IV SOLN
INTRAVENOUS | Status: AC
  Filled 2019-07-23: qty 500

## 2019-07-23 MED ORDER — PHENYLEPHRINE HCL-NACL 10-0.9 MG/250ML-% IV SOLN
INTRAVENOUS | Status: DC | PRN
  Administered 2019-07-23: 30 ug/min via INTRAVENOUS

## 2019-07-23 MED ORDER — HEPARIN SODIUM (PORCINE) 1000 UNIT/ML IJ SOLN
INTRAMUSCULAR | Status: AC
  Filled 2019-07-23: qty 1

## 2019-07-23 MED ORDER — FENTANYL CITRATE (PF) 100 MCG/2ML IJ SOLN
INTRAMUSCULAR | Status: DC | PRN
  Administered 2019-07-23: 100 ug via INTRAVENOUS

## 2019-07-23 MED ORDER — SODIUM CHLORIDE 0.9 % IV SOLN: 250.0000 mL | INTRAVENOUS | Status: DC | PRN

## 2019-07-23 MED ORDER — PROTAMINE SULFATE 10 MG/ML IV SOLN
INTRAVENOUS | Status: DC | PRN
  Administered 2019-07-23: 40 mg via INTRAVENOUS

## 2019-07-23 MED ORDER — ACETAMINOPHEN 325 MG PO TABS: 650.0000 mg | ORAL_TABLET | ORAL | Status: DC | PRN

## 2019-07-23 SURGICAL SUPPLY — 17 items
BLANKET WARM UNDERBOD FULL ACC (MISCELLANEOUS) ×3 IMPLANT
CATH MAPPNG PENTARAY F 2-6-2MM (CATHETERS) ×1 IMPLANT
CATH SMTCH THERMOCOOL SF DF (CATHETERS) ×3 IMPLANT
CATH SOUNDSTAR ECO REPROCESSED (CATHETERS) ×3 IMPLANT
CATH WEBSTER BI DIR CS D-F CRV (CATHETERS) ×3 IMPLANT
COVER SWIFTLINK CONNECTOR (BAG) ×3 IMPLANT
NEEDLE BAYLIS TRANSSEPTAL 71CM (NEEDLE) ×3 IMPLANT
PACK EP LATEX FREE (CUSTOM PROCEDURE TRAY) ×2
PACK EP LF (CUSTOM PROCEDURE TRAY) ×1 IMPLANT
PAD PRO RADIOLUCENT 2001M-C (PAD) ×3 IMPLANT
PATCH CARTO3 (PAD) ×3 IMPLANT
PENTARAY F 2-6-2MM (CATHETERS) ×3
SHEATH AVANTI 11F 11CM (SHEATH) ×3 IMPLANT
SHEATH PINNACLE 7F 10CM (SHEATH) ×6 IMPLANT
SHEATH PROBE COVER 6X72 (BAG) ×3 IMPLANT
SHEATH SWARTZ TS SL2 63CM 8.5F (SHEATH) ×3 IMPLANT
TUBING SMART ABLATE COOLFLOW (TUBING) ×3 IMPLANT

## 2019-07-23 NOTE — Discharge Instructions (Signed)
Cardiac Ablation, Care After This sheet gives you information about how to care for yourself after your procedure. Your health care provider may also give you more specific instructions. If you have problems or questions, contact your health care provider. What can I expect after the procedure? After the procedure, it is common to have:  Bruising around your puncture site.  Tenderness around your puncture site.  Skipped heartbeats.  Tiredness (fatigue).  Follow these instructions at home: Puncture site care   Follow instructions from your health care provider about how to take care of your puncture site. Make sure you: ? If present, leave stitches (sutures), skin glue, or adhesive strips in place. These skin closures may need to stay in place for up to 2 weeks. If adhesive strip edges start to loosen and curl up, you may trim the loose edges. Do not remove adhesive strips completely unless your health care provider tells you to do that.  Check your puncture site every day for signs of infection. Check for: ? Redness, swelling, or pain. ? Fluid or blood. If your puncture site starts to bleed, lie down on your back, apply firm pressure to the area, and contact your health care provider. ? Warmth. ? Pus or a bad smell. Driving  Do not drive for at least 4 days after your procedure or however long your health care provider recommends.  Do not drive or use heavy machinery while taking prescription pain medicine. Activity  Avoid activities that take a lot of effort for at least 7 days after your procedure.  Do not lift anything that is heavier than 5 lb (4.5 kg) for one week.   No sexual activity for 1 week.   Return to your normal activities as told by your health care provider. Ask your health care provider what activities are safe for you. General instructions  Take over-the-counter and prescription medicines only as told by your health care provider.  Do not use any products  that contain nicotine or tobacco, such as cigarettes and e-cigarettes. If you need help quitting, ask your health care provider.  You may shower after 24 hours, but Do not take baths, swim, or use a hot tub for 1 week.   Do not drink alcohol for 24 hours after your procedure.  Keep all follow-up visits as told by your health care provider. This is important. Contact a health care provider if:  You have redness, mild swelling, or pain around your puncture site.  You have fluid or blood coming from your puncture site that stops after applying firm pressure to the area.  Your puncture site feels warm to the touch.  You have pus or a bad smell coming from your puncture site.  You have a fever.  You have chest pain or discomfort that spreads to your neck, jaw, or arm.  You are sweating a lot.  You feel nauseous.  You have a fast or irregular heartbeat.  You have shortness of breath.  You are dizzy or light-headed and feel the need to lie down.  You have pain or numbness in the arm or leg closest to your puncture site. Get help right away if:  Your puncture site suddenly swells.  Your puncture site is bleeding and the bleeding does not stop after applying firm pressure to the area. These symptoms may represent a serious problem that is an emergency. Do not wait to see if the symptoms will go away. Get medical help right away. Call your  local emergency services (911 in the [place] Do not drive yourself to the hospital. Summary  After the procedure, it is normal to have bruising and tenderness at the puncture site in your groin, neck, or forearm.  Check your puncture site every day for signs of infection.  Get help right away if your puncture site is bleeding and the bleeding does not stop after applying firm pressure to the area. This is a medical emergency. This information is not intended to replace advice given to you by your health care provider. Make sure you discuss any  questions you have with your health care provider.

## 2019-07-23 NOTE — H&P (Signed)
Chief Complaint:  palpitations  History of Present Illness:    Jose Cain is a 58 y.o. male who presents today for afib ablation.  He was recently cardioverted but quickly returned to afib. + fatigue.  Today, he denies symptoms of palpitations, chest pain, shortness of breath,  lower extremity edema, dizziness, presyncope, or syncope.  The patient is otherwise without complaint today.  The patient denies symptoms of fevers, chills, cough, or new SOB worrisome for COVID 19.      Past Medical History:  Diagnosis Date  . Diabetes mellitus without complication ([place])   . Hypertension   . Kidney stone   . Obesity   . Persistent atrial fibrillation [facility])          Past Surgical History:  Procedure Laterality Date  . ATRIAL FIBRILLATION ABLATION N/A 01/30/2018   Procedure: ATRIAL FIBRILLATION ABLATION;  Surgeon: Thompson Grayer, MD;  Location: [place] [facility];  Service: Cardiovascular;  Laterality: N/A;  . CARDIOVERSION N/A 12/12/2017   Procedure: CARDIOVERSION;  Surgeon: Nigel Mormon, MD;  Location: [place] ENDOSCOPY;  Service: Cardiovascular;  Laterality: N/A;  . CARDIOVERSION N/A 01/08/2018   Procedure: CARDIOVERSION;  Surgeon: Nigel Mormon, MD;  Location: [place] ENDOSCOPY;  Service: Cardiovascular;  Laterality: N/A;  . CARDIOVERSION N/A 06/11/2019   Procedure: CARDIOVERSION;  Surgeon: Nigel Mormon, MD;  Location: [facility] ENDOSCOPY;  Service: Cardiovascular;  Laterality: N/A;  . implantable loop recorder implantation  05/20/2019   Medtronic Reveal Linq model LNQ22 (SN [id number]) implantable loop recorder implanted by Dr Rayann Heman for afib management post ablation  . TEE WITHOUT CARDIOVERSION N/A 12/12/2017   Procedure: TRANSESOPHAGEAL ECHOCARDIOGRAM (TEE);  Surgeon: Nigel Mormon, MD;  Location: [facility] ENDOSCOPY;  Service: Cardiovascular;  Laterality: N/A;          Current Outpatient Medications  Medication Sig Dispense Refill  . cetirizine (ZYRTEC) 10 MG  tablet Take 10 mg by mouth daily as needed for allergies.     Marland Kitchen diltiazem (CARDIZEM CD) 120 MG 24 hr capsule Take 1 capsule (120 mg total) by mouth daily. 30 capsule 3  . Empagliflozin-metFORMIN HCl (SYNJARDY) 12-998 MG TABS Take 1 tablet by mouth 2 (two) times daily.    . Flaxseed, Linseed, (FLAXSEED OIL PO) Take 2,800 mg by mouth 2 (two) times daily.    Marland Kitchen losartan (COZAAR) 50 MG tablet Take 50 mg by mouth at bedtime.     . metoprolol succinate (TOPROL XL) 50 MG 24 hr tablet Take 1 tablet (50 mg total) by mouth daily. Take with or immediately following a meal. 90 tablet 3  . Multiple Vitamin (MULTIVITAMIN WITH MINERALS) TABS tablet Take 1 tablet by mouth daily.    . rosuvastatin (CRESTOR) 20 MG tablet Take 1 tablet (20 mg total) by mouth daily. 30 tablet 3  . sildenafil (VIAGRA) 50 MG tablet Take 50 mg by mouth as needed for erectile dysfunction.    Alveda Reasons 20 MG TABS tablet TAKE 1 TABLET BY MOUTH ONCE DAILY (Patient taking differently: Take 20 mg by mouth at bedtime. ) 90 tablet 3   No current facility-administered medications for this visit.     Allergies:   Penicillins   Social History:  The patient  reports that he quit smoking about 2 years ago. He has a 20.00 pack-year smoking history. He has never used smokeless tobacco. He reports that he does not drink alcohol or use drugs.   Family History:  The patient's family history includes Coronary artery disease in his mother; Diabetes  in his father; Heart disease in his mother.   ROS:  Please see the history of present illness.   All other systems are personally reviewed and negative.   Physical Exam: Vitals:   07/23/19 0555  BP: (!) 148/123  Pulse: (!) 128  Resp: 17  Temp: 98.3 F (36.8 C)  TempSrc: Oral  SpO2: 99%  Weight: 117.9 kg  Height: 6' (1.829 m)    GEN- The patient is well appearing, alert and oriented x 3 today.   Head- normocephalic, atraumatic Eyes-  Sclera clear, conjunctiva pink Ears-  hearing intact Oropharynx- clear Neck- supple, Lungs-   normal work of breathing Heart- iRRR GI- soft, NT, ND, + BS MS- no significant deformity or atrophy Skin- no rash or lesion Psych- euthymic mood, full affect Neuro- strength and sensation are intact   Labs/Other Tests and Data Reviewed:    Recent Labs: 06/03/2019: BUN 12; Creatinine, Ser 0.87; Potassium 4.6; Sodium 142      Wt Readings from Last 3 Encounters:  06/21/19 260 lb (117.9 kg)  06/11/19 270 lb (122.5 kg)  06/03/19 270 lb (122.5 kg)      ASSESSMENT & PLAN:    1.  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  Risk, benefits, and alternatives to repeat EP study and radiofrequency ablation for afib were also discussed in detail today. These risks include but are not limited to stroke, bleeding, vascular damage, tamponade, perforation, damage to the esophagus, lungs, and other structures, pulmonary vein stenosis, worsening renal function, and death. The patient understands these risk and wishes to proceed.    Cardiac CT reviewed with patient.  I have sent message to Dr Rosemary Holms regarding elevated calcium score.  May benefit from functional study at some point.  Reports compliance with xarelto without interruption.  [name] MD, [facility] 07/23/2019 7:23 AM

## 2019-07-23 NOTE — Anesthesia Postprocedure Evaluation (Signed)
Anesthesia Post Note  Patient: Jose Cain  Procedure(s) Performed: ATRIAL FIBRILLATION ABLATION (N/A )     Anesthesia Type: General Level of consciousness: awake and alert Pain management: pain level controlled Vital Signs Assessment: post-procedure vital signs reviewed and stable Respiratory status: spontaneous breathing, nonlabored ventilation and respiratory function stable Cardiovascular status: blood pressure returned to baseline and stable Postop Assessment: no apparent nausea or vomiting Anesthetic complications: no    Last Vitals:  Vitals:   07/23/19 1100 07/23/19 1106  BP: 102/78   Pulse: 99   Resp: 13   Temp:  (!) 36.3 C  SpO2: 98%     Last Pain:  Vitals:   07/23/19 1106  TempSrc: Temporal  PainSc:                  [name]

## 2019-07-23 NOTE — Transfer of Care (Signed)
Immediate Anesthesia Transfer of Care Note  Patient: Jose Cain  Procedure(s) Performed: ATRIAL FIBRILLATION ABLATION (N/A )  Patient Location: PACU  Anesthesia Type:General  Level of Consciousness: awake, alert  and oriented  Airway & Oxygen Therapy: Patient Spontanous Breathing  Post-op Assessment: Report given to RN  Post vital signs: Reviewed and stable  Last Vitals:  Vitals Value Taken Time  BP    Temp    Pulse 98 07/23/19 0956  Resp 28 07/23/19 0956  SpO2 98 % 07/23/19 0956  Vitals shown include unvalidated device data.  Last Pain:  Vitals:   07/23/19 0555  TempSrc: Oral  PainSc:       Patients Stated Pain Goal: 3 (22/33/61 2244)  Complications: No apparent anesthesia complications

## 2019-07-23 NOTE — Anesthesia Procedure Notes (Addendum)
Procedure Name: Intubation Date/Time: 07/23/2019 7:56 AM Performed by: Barrington Ellison, CRNA Pre-anesthesia Checklist: Patient identified, Emergency Drugs available, Suction available and Patient being monitored Patient Re-evaluated:Patient Re-evaluated prior to induction Oxygen Delivery Method: Circle System Utilized Preoxygenation: Pre-oxygenation with 100% oxygen Induction Type: IV induction Ventilation: Mask ventilation without difficulty and Two handed mask ventilation required Laryngoscope Size: Glidescope and 4 Grade View: Grade I Tube type: Oral Tube size: 7.5 mm Number of attempts: 3 Airway Equipment and Method: Stylet,  Oral airway and Video-laryngoscopy Placement Confirmation: ETT inserted through vocal cords under direct vision,  positive ETCO2 and breath sounds checked- equal and bilateral Secured at: 24 cm Tube secured with: Tape Dental Injury: Teeth and Oropharynx as per pre-operative assessment  Difficulty Due To: Difficulty was anticipated and Difficult Airway- due to anterior larynx Future Recommendations: Recommend- induction with short-acting agent, and alternative techniques readily available Comments: Elective Glidescope use due to previous intubation notes. Grade 1 view with Glidescope, but difficulty due to flexible stylet used, recommend having a glidescope stylet available.

## 2019-07-23 NOTE — Progress Notes (Signed)
Rt groin 3 way stopcock removed, suture cut and light manual pressure held for 5 minutes. No bleeding. Level 0. Dressed w/gauze and tegaderm. IV locked.

## 2019-07-26 ENCOUNTER — Telehealth: Payer: Self-pay | Admitting: Cardiology

## 2019-07-26 DIAGNOSIS — R931 Abnormal findings on diagnostic imaging of heart and coronary circulation: Secondary | ICD-10-CM

## 2019-07-26 DIAGNOSIS — I480 Paroxysmal atrial fibrillation: Secondary | ICD-10-CM

## 2019-07-26 DIAGNOSIS — R9431 Abnormal electrocardiogram [ECG] [EKG]: Secondary | ICD-10-CM

## 2019-07-26 NOTE — Telephone Encounter (Signed)
Please let the patient know the following.  Dr. Rayann Heman mentioned about the successful Afib ablation. There was elevated calcium score seen on CT scan. Recommend nuclear stress test to evaluate for any obstruction to blood flow.   Thanks MJP

## 2019-07-29 ENCOUNTER — Ambulatory Visit (INDEPENDENT_AMBULATORY_CARE_PROVIDER_SITE_OTHER): Payer: 59 | Admitting: *Deleted

## 2019-07-29 DIAGNOSIS — I4891 Unspecified atrial fibrillation: Secondary | ICD-10-CM | POA: Diagnosis not present

## 2019-07-30 ENCOUNTER — Encounter (INDEPENDENT_AMBULATORY_CARE_PROVIDER_SITE_OTHER): Payer: 59 | Admitting: Ophthalmology

## 2019-07-30 ENCOUNTER — Other Ambulatory Visit: Payer: Self-pay

## 2019-07-30 DIAGNOSIS — E113293 Type 2 diabetes mellitus with mild nonproliferative diabetic retinopathy without macular edema, bilateral: Secondary | ICD-10-CM

## 2019-07-30 DIAGNOSIS — H2513 Age-related nuclear cataract, bilateral: Secondary | ICD-10-CM

## 2019-07-30 DIAGNOSIS — I10 Essential (primary) hypertension: Secondary | ICD-10-CM

## 2019-07-30 DIAGNOSIS — H35033 Hypertensive retinopathy, bilateral: Secondary | ICD-10-CM | POA: Diagnosis not present

## 2019-07-30 DIAGNOSIS — E11319 Type 2 diabetes mellitus with unspecified diabetic retinopathy without macular edema: Secondary | ICD-10-CM | POA: Diagnosis not present

## 2019-07-30 DIAGNOSIS — H43813 Vitreous degeneration, bilateral: Secondary | ICD-10-CM

## 2019-07-30 LAB — CUP PACEART REMOTE DEVICE CHECK
Date Time Interrogation Session: 20201221153517
Implantable Pulse Generator Implant Date: 20201012

## 2019-07-30 NOTE — Telephone Encounter (Signed)
From patient please respond

## 2019-08-07 ENCOUNTER — Other Ambulatory Visit: Payer: Self-pay

## 2019-08-07 ENCOUNTER — Ambulatory Visit (INDEPENDENT_AMBULATORY_CARE_PROVIDER_SITE_OTHER): Payer: 59

## 2019-08-07 DIAGNOSIS — I480 Paroxysmal atrial fibrillation: Secondary | ICD-10-CM

## 2019-08-07 DIAGNOSIS — R9431 Abnormal electrocardiogram [ECG] [EKG]: Secondary | ICD-10-CM

## 2019-08-07 DIAGNOSIS — R931 Abnormal findings on diagnostic imaging of heart and coronary circulation: Secondary | ICD-10-CM

## 2019-08-13 ENCOUNTER — Encounter: Payer: Self-pay | Admitting: Cardiology

## 2019-08-13 ENCOUNTER — Telehealth: Payer: 59 | Admitting: Cardiology

## 2019-08-13 VITALS — BP 139/82 | HR 85 | Temp 98.4°F | Ht 72.0 in | Wt 268.0 lb

## 2019-08-13 DIAGNOSIS — R931 Abnormal findings on diagnostic imaging of heart and coronary circulation: Secondary | ICD-10-CM | POA: Diagnosis not present

## 2019-08-13 DIAGNOSIS — I480 Paroxysmal atrial fibrillation: Secondary | ICD-10-CM

## 2019-08-13 DIAGNOSIS — R9439 Abnormal result of other cardiovascular function study: Secondary | ICD-10-CM | POA: Diagnosis not present

## 2019-08-13 DIAGNOSIS — I2510 Atherosclerotic heart disease of native coronary artery without angina pectoris: Secondary | ICD-10-CM

## 2019-08-13 DIAGNOSIS — G4733 Obstructive sleep apnea (adult) (pediatric): Secondary | ICD-10-CM | POA: Diagnosis not present

## 2019-08-13 DIAGNOSIS — E782 Mixed hyperlipidemia: Secondary | ICD-10-CM

## 2019-08-13 MED ORDER — ASPIRIN EC 81 MG PO TBEC: 81.0000 mg | DELAYED_RELEASE_TABLET | Freq: Every day | ORAL | 3 refills | Status: DC

## 2019-08-13 NOTE — Progress Notes (Signed)
Patient is here for follow up visit.  Subjective:   @Patient  ID: Jose Cain, male    DOB: 1960-10-08, 59 y.o.   MRN: [id number]   I connected with the patient on 08/13/2019 by a video enabled telemedicine application and verified that I am speaking with the correct person using two identifiers.      I discussed the limitations of evaluation and management by telemedicine and the availability of in person appointments. The patient expressed understanding and agreed to proceed.   This visit type was conducted due to national recommendations for restrictions regarding the COVID-19 Pandemic (e.g. social distancing).  This format is felt to be most appropriate for this patient at this time.  All issues noted in this document were discussed and addressed.  No physical exam was performed (except for noted visual exam findings with Tele health visits).  The patient has consented to conduct a Tele health visit and understands insurance will be billed.    Chief Complaint  Patient presents with  . Abnormal stress test    HPI  55 Caucasian male with hypertension, controlled type II diabetes mellitus, moderate obesity, OSA on CPAP, paroxysmal Afib/flutter.  Patient underwent second ablation by Dr. Rayann Heman on 07/23/2019. Pre ablation CT scan showed elevated calcium score of 508 involving all 3 major epicardial vessels. Subsequent Lexiscan stress test showed medium sized, mild intensity, predominantly reversible, apical to basal, inferior/inferolateral perfusion defect, suggesting ischemia in LCx/PDA territory, with normal LVEF 57%.  He currently walks about 3 miles/an hour 30 min everyday, 5 days a week. He denies chest pain, shortness of breath, palpitations, orthopnea, PND, TIA/syncope.  Leg edema has improved.  Patient is already on Xarelto 20 mg daily, without any bleeding.  He had colonoscopy earlier in 2020, had one polyp removed.  However, he has never had any melena.  Hemoglobin is  normal.  He started on Crestor 20 mg daily.  Last lipid panel was checked in August 2020.  Triglyceride and LDL were elevated, history of referral.  Past Medical History:  Diagnosis Date  . Diabetes mellitus without complication ([place])   . Hypertension   . Kidney stone   . Obesity   . Persistent atrial fibrillation [facility])      Past Surgical History:  Procedure Laterality Date  . ATRIAL FIBRILLATION ABLATION N/A 01/30/2018   Procedure: ATRIAL FIBRILLATION ABLATION;  Surgeon: Thompson Grayer, MD;  Location: [place] CV LAB;  Service: Cardiovascular;  Laterality: N/A;  . ATRIAL FIBRILLATION ABLATION N/A 07/23/2019   Procedure: ATRIAL FIBRILLATION ABLATION;  Surgeon: Thompson Grayer, MD;  Location: [place] CV LAB;  Service: Cardiovascular;  Laterality: N/A;  . CARDIOVERSION N/A 12/12/2017   Procedure: CARDIOVERSION;  Surgeon: Nigel Mormon, MD;  Location: [place] ENDOSCOPY;  Service: Cardiovascular;  Laterality: N/A;  . CARDIOVERSION N/A 01/08/2018   Procedure: CARDIOVERSION;  Surgeon: Nigel Mormon, MD;  Location: [place] ENDOSCOPY;  Service: Cardiovascular;  Laterality: N/A;  . CARDIOVERSION N/A 06/11/2019   Procedure: CARDIOVERSION;  Surgeon: Nigel Mormon, MD;  Location: [facility] ENDOSCOPY;  Service: Cardiovascular;  Laterality: N/A;  . implantable loop recorder implantation  05/20/2019   Medtronic Reveal Linq model LNQ22 (SN [id number]) implantable loop recorder implanted by Dr Rayann Heman for afib management post ablation  . TEE WITHOUT CARDIOVERSION N/A 12/12/2017   Procedure: TRANSESOPHAGEAL ECHOCARDIOGRAM (TEE);  Surgeon: Nigel Mormon, MD;  Location: [facility] ENDOSCOPY;  Service: Cardiovascular;  Laterality: N/A;     Social History   Socioeconomic History  . Marital status:  Married    Spouse name: Not on file  . Number of children: 3  . Years of education: Not on file  . Highest education level: Not on file  Occupational History  . Not on file  Tobacco Use  . Smoking  status: Former Smoker    Packs/day: 1.00    Years: 20.00    Pack years: 20.00    Quit date: 03/2017    Years since quitting: 2.4  . Smokeless tobacco: Never Used  Substance and Sexual Activity  . Alcohol use: No  . Drug use: No  . Sexual activity: Not on file  Other Topics Concern  . Not on file  Social History Narrative  . Not on file   Social Determinants of Health   Financial Resource Strain:   . Difficulty of Paying Living Expenses: Not on file  Food Insecurity:   . Worried About Charity fundraiser in the Last Year: Not on file  . Ran Out of Food in the Last Year: Not on file  Transportation Needs:   . Lack of Transportation (Medical): Not on file  . Lack of Transportation (Non-Medical): Not on file  Physical Activity:   . Days of Exercise per Week: Not on file  . Minutes of Exercise per Session: Not on file  Stress:   . Feeling of Stress : Not on file  Social Connections:   . Frequency of Communication with Friends and Family: Not on file  . Frequency of Social Gatherings with Friends and Family: Not on file  . Attends Religious Services: Not on file  . Active Member of Clubs or Organizations: Not on file  . Attends Archivist Meetings: Not on file  . Marital Status: Not on file  Intimate Partner Violence:   . Fear of Current or Ex-Partner: Not on file  . Emotionally Abused: Not on file  . Physically Abused: Not on file  . Sexually Abused: Not on file     Current Outpatient Medications on File Prior to Visit  Medication Sig Dispense Refill  . acetaminophen (TYLENOL) 500 MG tablet Take 500-1,000 mg by mouth every 6 (six) hours as needed (headches/pain.).    Marland Kitchen cetirizine (ZYRTEC) 10 MG tablet Take 10 mg by mouth daily as needed for allergies.     Marland Kitchen diltiazem (CARDIZEM CD) 120 MG 24 hr capsule Take 1 capsule (120 mg total) by mouth daily. 30 capsule 3  . Empagliflozin-metFORMIN HCl (SYNJARDY) 12-998 MG TABS Take 1 tablet by mouth 2 (two) times daily.     Marland Kitchen losartan (COZAAR) 50 MG tablet Take 50 mg by mouth every evening.     . metoprolol succinate (TOPROL XL) 50 MG 24 hr tablet Take 1 tablet (50 mg total) by mouth daily. Take with or immediately following a meal. 90 tablet 3  . Multiple Vitamin (MULTIVITAMIN WITH MINERALS) TABS tablet Take 1 tablet by mouth daily.    . Omega 3 1200 MG CAPS Take 2,400 mg by mouth 2 (two) times daily.    . pantoprazole (PROTONIX) 40 MG tablet Take 1 tablet (40 mg total) by mouth daily. 45 tablet 0  . rosuvastatin (CRESTOR) 20 MG tablet Take 1 tablet (20 mg total) by mouth daily. (Patient taking differently: Take 20 mg by mouth every evening. ) 90 tablet 1  . sildenafil (VIAGRA) 50 MG tablet Take 50 mg by mouth as needed for erectile dysfunction.    Alveda Reasons 20 MG TABS tablet TAKE 1 TABLET BY MOUTH ONCE  DAILY (Patient taking differently: Take 20 mg by mouth every evening. ) 90 tablet 3   No current facility-administered medications on file prior to visit.    Cardiovascular studies:   Lexiscan Sestamibi stress test 08/07/2019: No previous exam available for comparison. Lexiscan/walking nuclear stress test performed using 1-day protocol. Rest and stress EKG show sinus rhythm/tachycardia, RBBB, LAFB.  SPECT images show medium sized, mild intensity, predominantly reversible, apical to basal, inferior/inferolateral perfusion defect, suggesting ischemia in LCx/PDA territory.  Stres LVEF 57%. Intermediate risk study.   CT Chest 07/19/2019: 1.  Moderate bi atrial enlargement No LAA thrombus 2.  Normal PV anatomy measurements above 3. Calcium score 508 involving all 3 major epicardial vessels consider f/u perfusion study. 4.  Normal aortic root 3.6 cm 5.  No ASD/PFO 6.  No pericardial effusion  EKG 06/03/2019: Atrial fibrillation 141 bpm.  Diffuse low voltage.  Right bundle branch block.  Left posterior fascicular block. Diffuse nonspecific T-abnormality.   EKG 10/26/2018: Sinus tachycardia  Right  bundle branch block with left axis -bifascicular block.  Inferior infarct -age undetermined.  No significant change compared to previous EKG in 07/2018.  Echocardiogram 04/12/2018: Left ventricle cavity is normal in size. Moderate concentric hypertrophy of the left ventricle. Normal diastolic filling pattern. Normal global wall motion. Normal LV systolic function. Calculated EF 55%. Compared to 12/29/2017, EF has improved from 35% and sevre LA enlargement is not noted now.  Ablation 01/30/2018: Atrial fibrillation and flutter ablation by Dr. Thompson Grayer  CT cardiac morph/pulm vein 01/29/2018: No significant non-cardiac abnormality seen in visualized portion of the thorax.  EKG 01/17/2018: Atypical atrial flutter with 2:1 A-V conduction. Ventricular rate 118 bpm. ) Bundle branch block left anterior fascicular block. Lateral T wave inversions, cannot exclude ischemia. EKG 12/22/2017: Atrial fibrillation with rapid ventricular response 119 bpm. Right branch block, left anterior fascicular block. No ischemic changes. EKG 12/08/2017: Atypical atrial flutter with variable conduction. Average ventricular rate 140 bpm. Right bundle branch block, left anterior fascicular block. Nonspecific ST-T changes. Low-voltage. CHA2DS2VASc score 2: Annual stroke risk 2.2%  Cardioversion [01/08/2018]: Successful cardioversion 120 J X2  Echocardiogram TEE 12/12/2017: - Left ventricle: The estimated ejection fraction was in the range of 50% to 55%. - Mitral valve: There was mild to moderate regurgitation. - Left atrium: The atrium was moderately dilated. No evidence of thrombus in the atrial cavity or appendage. Acoustic contrast opacification revealed no evidence ofthrombusin the atrial cavity or appendage. Emptying velocity was mildly reduced.  EKG 12/22/2017: Atrial fibrillation with rapid ventricular response 127 bpm. Right bundle branch block. Left anterior fascicular block. No iscehmic changes.  Sleep study  xx/xx/20xx: positive for sleep apnea. On CPAP and compliant.    Recent labs: 07/18/2019: Glucose 131, BUN/Cr 16/0.83. EGFR 98. Na/K 137/4.2. Rest of the CMP normal H/H 16/46. MCV 94. Platelets 247  03/27/2019: Chol 170, TG 315, HDL 30, LDL 125  02/16/2018:  HbA1c 6.9%.     Review of Systems  Constitution: Negative for decreased appetite, malaise/fatigue, weight gain and weight loss.  HENT: Negative for congestion.   Eyes: Negative for visual disturbance.  Cardiovascular: Positive for leg swelling (Occasional leg swelling). Negative for chest pain, dyspnea on exertion (improved), palpitations and syncope.  Respiratory: Negative for shortness of breath.   Endocrine: Negative for cold intolerance.  Hematologic/Lymphatic: Does not bruise/bleed easily.  Skin: Negative for itching and rash.  Musculoskeletal: Negative for myalgias.  Gastrointestinal: Negative for abdominal pain, nausea and vomiting.  Genitourinary: Negative for dysuria.  Neurological: Negative  for dizziness and weakness.  Psychiatric/Behavioral: The patient is not nervous/anxious.   All other systems reviewed and are negative.      Objective:    Vitals:   08/13/19 1341  BP: 139/82  Pulse: 85  Temp: 98.4 F (36.9 C)     Physical Exam  Constitutional: He is oriented to person, place, and time. He appears well-developed and well-nourished. No distress.  Pulmonary/Chest: Effort normal.  Musculoskeletal:        General: Edema (Trace RLE) present.  Neurological: He is alert and oriented to person, place, and time.  Psychiatric: He has a normal mood and affect.  Nursing note and vitals reviewed.       Assessment & Recommendations:    58 Caucasian male with hypertension, controlled type II diabetes mellitus, moderate obesity, OSA on CPAP, paroxysmal Afib/flutter, elevated calcium score, abnormal stress test  Elevated calcium score, abnormal stress test: Calcium score 508.SPECT images show medium sized,  mild intensity, predominantly reversible, apical to basal, inferior/inferolateral perfusion defect, suggesting ischemia in LCx/PDA territory.  Patient is completely asymptomatic at this time. LVEF is normal. Therefore, I would continue aggressive medical management. In absence of bleeding, added Aspirin 81 mg daily. Discussed risks of bleeding while on Xarelto. Avoid NSAIDS. Continue crestor 20 mg daily. Repeat lipid panel in February 2021.  Continue metoprolol, diltiazem, losartan at current doses Continue Synjardy, which has empaglifozin.  I will see him in February after repeat lipid panel. If he has any symptoms of chest pain, shortness of breath, he knows to contact me sooner.   Paroxysmal Afib/flutter: S/p repeat ablation by Dr. Rayann Heman in 07/2019. Continue metoprolol succinate 25 mg daily, diltiazem 120 mg daily. CHA2DS2VAsc score 3 with h/o heart failure. Continue Xarelto 20 mg once daily.  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 EF of 55%.  Dyslipidemia: As above.  Hypertension: Controlled.    Nigel Mormon, MD [facility] [place] Pager: [phone] Office: [phone] If no answer Cell [phone]

## 2019-08-20 ENCOUNTER — Other Ambulatory Visit: Payer: Self-pay

## 2019-08-20 ENCOUNTER — Encounter (HOSPITAL_COMMUNITY): Payer: Self-pay | Admitting: Physician Assistant

## 2019-08-20 ENCOUNTER — Ambulatory Visit (HOSPITAL_COMMUNITY)
Admission: RE | Admit: 2019-08-20 | Discharge: 2019-08-20 | Disposition: A | Discharge status: Home / Self Care | Payer: 59 | Source: Ambulatory Visit | Attending: Physician Assistant | Admitting: Physician Assistant

## 2019-08-20 VITALS — BP 124/64 | HR 87 | Ht 72.0 in | Wt 265.8 lb

## 2019-08-20 DIAGNOSIS — E669 Obesity, unspecified: Secondary | ICD-10-CM | POA: Diagnosis not present

## 2019-08-20 DIAGNOSIS — Z880 Allergy status to penicillin: Secondary | ICD-10-CM | POA: Diagnosis not present

## 2019-08-20 DIAGNOSIS — Z87891 Personal history of nicotine dependence: Secondary | ICD-10-CM | POA: Insufficient documentation

## 2019-08-20 DIAGNOSIS — Z7984 Long term (current) use of oral hypoglycemic drugs: Secondary | ICD-10-CM | POA: Insufficient documentation

## 2019-08-20 DIAGNOSIS — G4733 Obstructive sleep apnea (adult) (pediatric): Secondary | ICD-10-CM | POA: Insufficient documentation

## 2019-08-20 DIAGNOSIS — Z79899 Other long term (current) drug therapy: Secondary | ICD-10-CM | POA: Insufficient documentation

## 2019-08-20 DIAGNOSIS — I10 Essential (primary) hypertension: Secondary | ICD-10-CM | POA: Insufficient documentation

## 2019-08-20 DIAGNOSIS — E119 Type 2 diabetes mellitus without complications: Secondary | ICD-10-CM | POA: Diagnosis not present

## 2019-08-20 DIAGNOSIS — D6869 Other thrombophilia: Secondary | ICD-10-CM | POA: Diagnosis not present

## 2019-08-20 DIAGNOSIS — I2510 Atherosclerotic heart disease of native coronary artery without angina pectoris: Secondary | ICD-10-CM | POA: Diagnosis not present

## 2019-08-20 DIAGNOSIS — Z7982 Long term (current) use of aspirin: Secondary | ICD-10-CM | POA: Insufficient documentation

## 2019-08-20 DIAGNOSIS — Z7901 Long term (current) use of anticoagulants: Secondary | ICD-10-CM | POA: Diagnosis not present

## 2019-08-20 DIAGNOSIS — I4819 Other persistent atrial fibrillation: Secondary | ICD-10-CM | POA: Insufficient documentation

## 2019-08-20 DIAGNOSIS — I4892 Unspecified atrial flutter: Secondary | ICD-10-CM | POA: Insufficient documentation

## 2019-08-20 NOTE — Progress Notes (Signed)
Primary Care Physician: Gwenlyn Found, MD Referring Physician: Dr. Johney Frame Primary Cardiologist: Dr Lynnell Catalan is a 59 y.o. male with a h/o hypertension, controlled type II diabetes mellitus, obesity, OSA on CPAP, and persistent Afib/flutter who presents to the [facility] for follow up. Patient is s/p repeat afib ablation with Dr Johney Frame on 07/23/19. Since the procedure, patient reports that he has done very well. ILR shows no afib since the ablation. He denies any CP, swallowing, or groin issues. He is on Xarelto for a CHADS2VASC score of 3.   Today, he denies symptoms of palpitations, chest pain, shortness of breath, orthopnea, PND, lower extremity edema, dizziness, presyncope, syncope, or neurologic sequela. The patient is tolerating medications without difficulties and is otherwise without complaint today.   Past Medical History:  Diagnosis Date  . Diabetes mellitus without complication ([facility])   . Hypertension   . Kidney stone   . Obesity   . Persistent atrial fibrillation [facility])    Past Surgical History:  Procedure Laterality Date  . ATRIAL FIBRILLATION ABLATION N/A 01/30/2018   Procedure: ATRIAL FIBRILLATION ABLATION;  Surgeon: [name], MD;  Location: [facility] INVASIVE CV LAB;  Service: Cardiovascular;  Laterality: N/A;  . ATRIAL FIBRILLATION ABLATION N/A 07/23/2019   Procedure: ATRIAL FIBRILLATION ABLATION;  Surgeon: [name], MD;  Location: [facility] INVASIVE CV LAB;  Service: Cardiovascular;  Laterality: N/A;  . CARDIOVERSION N/A 12/12/2017   Procedure: CARDIOVERSION;  Surgeon: Elder Negus, MD;  Location: [facility] ENDOSCOPY;  Service: Cardiovascular;  Laterality: N/A;  . CARDIOVERSION N/A 01/08/2018   Procedure: CARDIOVERSION;  Surgeon: Elder Negus, MD;  Location: [facility] ENDOSCOPY;  Service: Cardiovascular;  Laterality: N/A;  . CARDIOVERSION N/A 06/11/2019   Procedure: CARDIOVERSION;  Surgeon: Elder Negus, MD;  Location:  [facility] ENDOSCOPY;  Service: Cardiovascular;  Laterality: N/A;  . implantable loop recorder implantation  05/20/2019   Medtronic Reveal Linq model LNQ22 (SN [id number]) implantable loop recorder implanted by Dr Johney Frame for afib management post ablation  . TEE WITHOUT CARDIOVERSION N/A 12/12/2017   Procedure: TRANSESOPHAGEAL ECHOCARDIOGRAM (TEE);  Surgeon: Elder Negus, MD;  Location: [facility] ENDOSCOPY;  Service: Cardiovascular;  Laterality: N/A;    Current Outpatient Medications  Medication Sig Dispense Refill  . acetaminophen (TYLENOL) 500 MG tablet Take 500-1,000 mg by mouth every 6 (six) hours as needed (headches/pain.).    Marland Kitchen aspirin EC 81 MG tablet Take 1 tablet (81 mg total) by mouth daily. 90 tablet 3  . diltiazem (CARDIZEM CD) 120 MG 24 hr capsule Take 1 capsule (120 mg total) by mouth daily. 30 capsule 3  . Empagliflozin-metFORMIN HCl (SYNJARDY) 12-998 MG TABS Take 1 tablet by mouth 2 (two) times daily.    Marland Kitchen losartan (COZAAR) 50 MG tablet Take 50 mg by mouth every evening.     . metoprolol succinate (TOPROL XL) 50 MG 24 hr tablet Take 1 tablet (50 mg total) by mouth daily. Take with or immediately following a meal. 90 tablet 3  . Multiple Vitamin (MULTIVITAMIN WITH MINERALS) TABS tablet Take 1 tablet by mouth daily.    . Omega 3 1200 MG CAPS Take 2,400 mg by mouth 2 (two) times daily.    . rosuvastatin (CRESTOR) 20 MG tablet Take 1 tablet (20 mg total) by mouth daily. (Patient taking differently: Take 20 mg by mouth every evening. ) 90 tablet 1  . sildenafil (VIAGRA) 50 MG tablet Take 50 mg by mouth as needed for erectile dysfunction.    Marland Kitchen  XARELTO 20 MG TABS tablet TAKE 1 TABLET BY MOUTH ONCE DAILY (Patient taking differently: Take 20 mg by mouth every evening. ) 90 tablet 3   No current facility-administered medications for this encounter.    Allergies  Allergen Reactions  . Penicillins Swelling    Has patient had a PCN reaction causing immediate rash, facial/tongue/throat swelling,  SOB or lightheadedness with hypotension: yes Has patient had a PCN reaction causing severe rash involving mucus membranes or skin necrosis: no Has patient had a PCN reaction that required hospitalization: no Has patient had a PCN reaction occurring within the last 10 years: no If all of the above answers are "NO", then may proceed with Cephalosporin use.     Social History   Socioeconomic History  . Marital status: Married    Spouse name: Not on file  . Number of children: 3  . Years of education: Not on file  . Highest education level: Not on file  Occupational History  . Not on file  Tobacco Use  . Smoking status: Former Smoker    Packs/day: 1.00    Years: 20.00    Pack years: 20.00    Quit date: 03/2017    Years since quitting: 2.4  . Smokeless tobacco: Never Used  Substance and Sexual Activity  . Alcohol use: No  . Drug use: No  . Sexual activity: Not on file  Other Topics Concern  . Not on file  Social History Narrative  . Not on file   Social Determinants of Health   Financial Resource Strain:   . Difficulty of Paying Living Expenses: Not on file  Food Insecurity:   . Worried About Charity fundraiser in the Last Year: Not on file  . Ran Out of Food in the Last Year: Not on file  Transportation Needs:   . Lack of Transportation (Medical): Not on file  . Lack of Transportation (Non-Medical): Not on file  Physical Activity:   . Days of Exercise per Week: Not on file  . Minutes of Exercise per Session: Not on file  Stress:   . Feeling of Stress : Not on file  Social Connections:   . Frequency of Communication with Friends and Family: Not on file  . Frequency of Social Gatherings with Friends and Family: Not on file  . Attends Religious Services: Not on file  . Active Member of Clubs or Organizations: Not on file  . Attends Archivist Meetings: Not on file  . Marital Status: Not on file  Intimate Partner Violence:   . Fear of Current or  Ex-Partner: Not on file  . Emotionally Abused: Not on file  . Physically Abused: Not on file  . Sexually Abused: Not on file    Family History  Problem Relation Age of Onset  . Coronary artery disease Mother   . Heart disease Mother   . Diabetes Father     ROS- All systems are reviewed and negative except as per the HPI above  Physical Exam: There were no vitals filed for this visit. Wt Readings from Last 3 Encounters:  08/13/19 121.6 kg  07/23/19 117.9 kg  06/21/19 117.9 kg    Labs: Lab Results  Component Value Date   NA 137 07/18/2019   K 4.2 07/18/2019   CL 99 07/18/2019   CO2 21 07/18/2019   GLUCOSE 131 (H) 07/18/2019   BUN 16 07/18/2019   CREATININE 0.83 07/18/2019   CALCIUM 10.3 (H) 07/18/2019   MG  1.9 01/09/2018   No results found for: INR No results found for: CHOL, HDL, LDLCALC, TRIG  GEN- The patient is well appearing obese male, alert and oriented x 3 today.   HEENT-head normocephalic, atraumatic, sclera clear, conjunctiva pink, hearing intact, trachea midline. Lungs- Clear to ausculation bilaterally, normal work of breathing Heart- Regular rate and rhythm, no murmurs, rubs or gallops  GI- soft, NT, ND, + BS Extremities- no clubbing, cyanosis, or edema MS- no significant deformity or atrophy Skin- no rash or lesion Psych- euthymic mood, full affect Neuro- strength and sensation are intact   EKG- SR HR 87, RBBB, PR 180, QRS 134, QTc 493  Echocardiogram 06/28/2019: Left ventricle cavity is normal in size. Moderate concentric hypertrophy of the left ventricle. Normal global wall motion. Low normal LV systolic function. LVEF 50-55%. Unable to evaluate diastolic function due to atrial fibrillation. Calculated EF 51%. Left atrial cavity is mildly dilated, measuring 40 cc/m2. Moderate (Grade II) mitral regurgitation. Moderate tricuspid regurgitation. Mild pulmonary hypertension. Estimated pulmonary artery systolic pressure is 34 mmHg.  Compared to  previous study on 04/12/2018, mild LA dilatation, moderate mitral and tricuspid regurgitation, and mild pulmonary hypertension are new and likely reflect changes from recurrent atrial fibrillation.   Epic records reviewed   Assessment and Plan: 1. Persistent  Atrial fibrillation/atrial flutter S/p afib and flutter ablation 01/2018 and repeat afib ablation 12/15. Patient appears to be maintaining SR. Continue Toprol 50 mg daily Continue Xarelto 20 mg daily for at least 3 months post ablation with no missed doses.   This patients CHA2DS2-VASc Score and unadjusted Ischemic Stroke Rate (% per year) is equal to 3.2 % stroke rate/year from a score of 3  Above score calculated as 1 point each if present [CHF, HTN, DM, Vascular=MI/PAD/Aortic Plaque, Age if 65-74, or Male] Above score calculated as 2 points each if present [Age > 75, or Stroke/TIA/TE]   2. HTN Stable, no changes today.  3. CAD Coronary calcification seen on CT with elevated calcium score. Now on ASA 81 mg.  No anginal symptoms. Followed by Dr Rosemary Holms.   Follow up with Dr Johney Frame as scheduled.    Jorja Loa PA-C [facility] [address] [place], [place] [address] [phone]

## 2019-08-23 ENCOUNTER — Other Ambulatory Visit: Payer: Self-pay

## 2019-08-23 DIAGNOSIS — I480 Paroxysmal atrial fibrillation: Secondary | ICD-10-CM

## 2019-08-23 MED ORDER — DILTIAZEM HCL ER COATED BEADS 120 MG PO CP24: 120.0000 mg | ORAL_CAPSULE | Freq: Every day | ORAL | 3 refills | Status: DC

## 2019-09-02 ENCOUNTER — Ambulatory Visit (INDEPENDENT_AMBULATORY_CARE_PROVIDER_SITE_OTHER): Payer: 59 | Admitting: *Deleted

## 2019-09-02 DIAGNOSIS — I4891 Unspecified atrial fibrillation: Secondary | ICD-10-CM | POA: Diagnosis not present

## 2019-09-03 LAB — CUP PACEART REMOTE DEVICE CHECK
Date Time Interrogation Session: 20210125000025
Implantable Pulse Generator Implant Date: 20201012

## 2019-09-25 LAB — LIPID PANEL
Chol/HDL Ratio: 3.4 ratio (ref 0.0–5.0)
Cholesterol, Total: 98 mg/dL — ABNORMAL LOW (ref 100–199)
HDL: 29 mg/dL — ABNORMAL LOW (ref >39)
LDL Chol Calc (NIH): 40 mg/dL (ref 0–99)
Triglycerides: 175 mg/dL — ABNORMAL HIGH (ref 0–149)
VLDL Cholesterol Cal: 29 mg/dL (ref 5–40)

## 2019-09-27 ENCOUNTER — Ambulatory Visit: Payer: 59 | Admitting: Cardiology

## 2019-09-30 ENCOUNTER — Ambulatory Visit: Payer: 59 | Admitting: Cardiology

## 2019-09-30 ENCOUNTER — Encounter: Payer: Self-pay | Admitting: Cardiology

## 2019-09-30 ENCOUNTER — Other Ambulatory Visit: Payer: Self-pay

## 2019-09-30 VITALS — BP 136/82 | HR 78 | Temp 98.1°F | Ht 72.0 in | Wt 267.5 lb

## 2019-09-30 DIAGNOSIS — R931 Abnormal findings on diagnostic imaging of heart and coronary circulation: Secondary | ICD-10-CM

## 2019-09-30 DIAGNOSIS — I480 Paroxysmal atrial fibrillation: Secondary | ICD-10-CM

## 2019-09-30 DIAGNOSIS — E782 Mixed hyperlipidemia: Secondary | ICD-10-CM

## 2019-09-30 DIAGNOSIS — I2510 Atherosclerotic heart disease of native coronary artery without angina pectoris: Secondary | ICD-10-CM

## 2019-09-30 NOTE — Progress Notes (Signed)
  Patient is here for follow up visit.  Subjective:   @Patient ID: Jose Cain, male    DOB: 06/27/1961, 59 y.o.   MRN: [id number]   Chief Complaint  Patient presents with  . Atrial Fibrillation  . Coronary Artery Disease    3mo    HPI  59 Caucasian male with hypertension, controlled type II diabetes mellitus, moderate obesity, OSA on CPAP, paroxysmal Afib/flutter.  Patient underwent second ablation by Dr. Allred on 07/23/2019. Pre ablation CT scan showed elevated calcium score of 508 involving all 3 major epicardial vessels. Subsequent Lexiscan stress test showed medium sized, mild intensity, predominantly reversible, apical to basal, inferior/inferolateral perfusion defect, suggesting ischemia in LCx/PDA territory, with normal LVEF 57%.  He currently walks about 3 miles/an hour 30 min everyday, 5 days a week. He denies chest pain, shortness of breath, palpitations, orthopnea, PND, TIA/syncope.  Leg edema has improved.  Patient is already on Xarelto 20 mg daily, without any bleeding.  He had colonoscopy earlier in 2020, had one polyp removed.  However, he has never had any melena.  Hemoglobin is normal.  He started on Crestor 20 mg daily with significant improvement in LDL, details below.   Current Outpatient Medications on File Prior to Visit  Medication Sig Dispense Refill  . acetaminophen (TYLENOL) 500 MG tablet Take 500-1,000 mg by mouth every 6 (six) hours as needed (headches/pain.).    . aspirin EC 81 MG tablet Take 1 tablet (81 mg total) by mouth daily. 90 tablet 3  . diltiazem (CARDIZEM CD) 120 MG 24 hr capsule Take 1 capsule (120 mg total) by mouth daily. 30 capsule 3  . Empagliflozin-metFORMIN HCl (SYNJARDY) 12-998 MG TABS Take 1 tablet by mouth 2 (two) times daily.    . losartan (COZAAR) 50 MG tablet Take 50 mg by mouth every evening.     . metoprolol succinate (TOPROL XL) 50 MG 24 hr tablet Take 1 tablet (50 mg total) by mouth daily. Take with or immediately  following a meal. 90 tablet 3  . Multiple Vitamin (MULTIVITAMIN WITH MINERALS) TABS tablet Take 1 tablet by mouth daily.    . Omega 3 1200 MG CAPS Take 2,400 mg by mouth 2 (two) times daily.    . rosuvastatin (CRESTOR) 20 MG tablet Take 1 tablet (20 mg total) by mouth daily. (Patient taking differently: Take 20 mg by mouth every evening. ) 90 tablet 1  . sildenafil (VIAGRA) 50 MG tablet Take 50 mg by mouth as needed for erectile dysfunction.    . XARELTO 20 MG TABS tablet TAKE 1 TABLET BY MOUTH ONCE DAILY (Patient taking differently: Take 20 mg by mouth every evening. ) 90 tablet 3   No current facility-administered medications on file prior to visit.    Cardiovascular studies:   EKG 09/10/2019: Sinus rhythm 76 bpm.  First degree AV block. Right bundle branch block.   Lexiscan Sestamibi stress test 08/07/2019: No previous exam available for comparison. Lexiscan/walking nuclear stress test performed using 1-day protocol. Rest and stress EKG show sinus rhythm/tachycardia, RBBB, LAFB.  SPECT images show medium sized, mild intensity, predominantly reversible, apical to basal, inferior/inferolateral perfusion defect, suggesting ischemia in LCx/PDA territory.  Stres LVEF 57%. Intermediate risk study.   CT Chest 07/19/2019: 1.  Moderate bi atrial enlargement No LAA thrombus 2.  Normal PV anatomy measurements above 3. Calcium score 508 involving all 3 major epicardial vessels consider f/u perfusion study. 4.  Normal aortic root 3.6 cm 5.  No ASD/PFO 6.    No pericardial effusion  EKG 06/03/2019: Atrial fibrillation 141 bpm.  Diffuse low voltage.  Right bundle branch block.  Left posterior fascicular block. Diffuse nonspecific T-abnormality.    Recent labs: 09/24/2019: Chol 98, TG 175, HDL 29, LDL 40  07/18/2019: Glucose 131, BUN/Cr 16/0.83. EGFR 98. Na/K 137/4.2. Rest of the CMP normal H/H 16/46. MCV 94. Platelets 247  03/27/2019: Chol 170, TG 315, HDL 30, LDL 125  02/16/2018:    HbA1c 6.9%.     Review of Systems  Cardiovascular: Positive for leg swelling (Occasional). Negative for chest pain, dyspnea on exertion, palpitations and syncope.       Objective:   Vitals:   09/30/19 1340  BP: 136/82  Pulse: 78  Temp: 98.1 F (36.7 C)  SpO2: 99%     Physical Exam  Constitutional: He is oriented to person, place, and time. He appears well-developed and well-nourished. No distress.  Neck: No JVD present.  Cardiovascular: Normal rate, regular rhythm, normal heart sounds and intact distal pulses.  No murmur heard. Pulmonary/Chest: Effort normal and breath sounds normal. He has no wheezes. He has no rales.  Musculoskeletal:        General: No edema.  Neurological: He is alert and oriented to person, place, and time.  Psychiatric: He has a normal mood and affect.  Nursing note and vitals reviewed.       Assessment & Recommendations:    59 Caucasian male with hypertension, controlled type II diabetes mellitus, moderate obesity, OSA on CPAP, paroxysmal Afib/flutter, elevated calcium score, abnormal stress test  CAD without angina: Calcium score 508.SPECT images show medium sized, mild intensity, predominantly reversible, apical to basal, inferior/inferolateral perfusion defect, suggesting ischemia in LCx/PDA territory.  Patient is completely asymptomatic at this time. LVEF is normal. Therefore, I would continue aggressive medical management. In absence of bleeding, added Aspirin 81 mg daily. Discussed risks of bleeding while on Xarelto. Avoid NSAIDS. LDL down from 120 to 40. Continue crestor 20 mg daily.  Continue metoprolol, diltiazem, losartan at current doses Continue Synjardy, which has empaglifozin.  Paroxysmal Afib/flutter: S/p repeat ablation by Dr. Rayann Heman in 07/2019. Continue metoprolol succinate 25 mg daily, diltiazem 120 mg daily. CHA2DS2VAsc score 3 with h/o heart failure. Continue Xarelto 20 mg once daily.  Arrhythmia induced  cardiomyopathy: Resolved after successful atrial fibrillation, flutter ablation. EF of 55%.  Dyslipidemia: As above.  Hypertension: Controlled.   F/u in 6 months  Melchizedek Espinola Esther Hardy, MD [facility] Pager: [phone] Office: [phone] If no answer Cell [phone]

## 2019-10-07 ENCOUNTER — Ambulatory Visit (INDEPENDENT_AMBULATORY_CARE_PROVIDER_SITE_OTHER): Payer: 59 | Admitting: *Deleted

## 2019-10-07 DIAGNOSIS — I4891 Unspecified atrial fibrillation: Secondary | ICD-10-CM | POA: Diagnosis not present

## 2019-10-07 LAB — CUP PACEART REMOTE DEVICE CHECK
Date Time Interrogation Session: 20210301100545
Implantable Pulse Generator Implant Date: 20201012

## 2019-10-07 NOTE — Progress Notes (Signed)
ILR Remote 

## 2019-10-21 ENCOUNTER — Other Ambulatory Visit: Payer: Self-pay

## 2019-10-21 ENCOUNTER — Encounter: Payer: Self-pay | Admitting: Internal Medicine

## 2019-10-21 ENCOUNTER — Telehealth (INDEPENDENT_AMBULATORY_CARE_PROVIDER_SITE_OTHER): Payer: 59 | Admitting: Internal Medicine

## 2019-10-21 VITALS — BP 118/72 | HR 83 | Ht 72.0 in | Wt 262.0 lb

## 2019-10-21 DIAGNOSIS — G4733 Obstructive sleep apnea (adult) (pediatric): Secondary | ICD-10-CM | POA: Diagnosis not present

## 2019-10-21 DIAGNOSIS — D6869 Other thrombophilia: Secondary | ICD-10-CM

## 2019-10-21 DIAGNOSIS — I2510 Atherosclerotic heart disease of native coronary artery without angina pectoris: Secondary | ICD-10-CM

## 2019-10-21 DIAGNOSIS — I4819 Other persistent atrial fibrillation: Secondary | ICD-10-CM

## 2019-10-21 DIAGNOSIS — I10 Essential (primary) hypertension: Secondary | ICD-10-CM

## 2019-10-21 NOTE — Progress Notes (Signed)
Electrophysiology TeleHealth Note   Due to national recommendations of social distancing due to COVID 19, an audio/video telehealth visit is felt to be most appropriate for this patient at this time.  See MyChart message from today for the patient's consent to telehealth for [facility].  Date:  10/21/2019   ID:  Jose Cain, DOB June 26, 1961, MRN [id number]  Location: patient's home  Provider location:  [facility]  Evaluation Performed: Follow-up visit  PCP:  Gwenlyn Found, MD   Electrophysiologist:  Dr Johney Frame  Chief Complaint:  palpitations  History of Present Illness:    Jose Cain is a 59 y.o. male who presents via telehealth conferencing today.  Since last being seen in our clinic, the patient reports doing very well.  Today, he denies symptoms of palpitations, chest pain, shortness of breath,  lower extremity edema, dizziness, presyncope, or syncope.  The patient is otherwise without complaint today.  The patient denies symptoms of fevers, chills, cough, or new SOB worrisome for COVID 19.  Past Medical History:  Diagnosis Date  . Diabetes mellitus without complication (HCC)   . Hypertension   . Kidney stone   . Obesity   . Persistent atrial fibrillation [facility])     Past Surgical History:  Procedure Laterality Date  . ATRIAL FIBRILLATION ABLATION N/A 01/30/2018   Procedure: ATRIAL FIBRILLATION ABLATION;  Surgeon: [name], MD;  Location: [facility] INVASIVE CV LAB;  Service: Cardiovascular;  Laterality: N/A;  . ATRIAL FIBRILLATION ABLATION N/A 07/23/2019   Procedure: ATRIAL FIBRILLATION ABLATION;  Surgeon: [name], MD;  Location: [facility] INVASIVE CV LAB;  Service: Cardiovascular;  Laterality: N/A;  . CARDIOVERSION N/A 12/12/2017   Procedure: CARDIOVERSION;  Surgeon: Elder Negus, MD;  Location: [facility] ENDOSCOPY;  Service: Cardiovascular;  Laterality: N/A;  . CARDIOVERSION N/A 01/08/2018   Procedure: CARDIOVERSION;  Surgeon: Elder Negus, MD;   Location: [facility] ENDOSCOPY;  Service: Cardiovascular;  Laterality: N/A;  . CARDIOVERSION N/A 06/11/2019   Procedure: CARDIOVERSION;  Surgeon: Elder Negus, MD;  Location: [facility] ENDOSCOPY;  Service: Cardiovascular;  Laterality: N/A;  . implantable loop recorder implantation  05/20/2019   Medtronic Reveal Linq model LNQ22 (SN [id number]) implantable loop recorder implanted by Dr Johney Frame for afib management post ablation  . TEE WITHOUT CARDIOVERSION N/A 12/12/2017   Procedure: TRANSESOPHAGEAL ECHOCARDIOGRAM (TEE);  Surgeon: Elder Negus, MD;  Location: [facility] ENDOSCOPY;  Service: Cardiovascular;  Laterality: N/A;    Current Outpatient Medications  Medication Sig Dispense Refill  . acetaminophen (TYLENOL) 500 MG tablet Take 500-1,000 mg by mouth every 6 (six) hours as needed (headches/pain.).    Marland Kitchen aspirin EC 81 MG tablet Take 1 tablet (81 mg total) by mouth daily. 90 tablet 3  . diltiazem (CARDIZEM CD) 120 MG 24 hr capsule Take 1 capsule (120 mg total) by mouth daily. 30 capsule 3  . Empagliflozin-metFORMIN HCl (SYNJARDY) 12-998 MG TABS Take 1 tablet by mouth 2 (two) times daily.    Marland Kitchen losartan (COZAAR) 50 MG tablet Take 50 mg by mouth every evening.     . metoprolol succinate (TOPROL XL) 50 MG 24 hr tablet Take 1 tablet (50 mg total) by mouth daily. Take with or immediately following a meal. 90 tablet 3  . Multiple Vitamin (MULTIVITAMIN WITH MINERALS) TABS tablet Take 1 tablet by mouth daily.    . Omega 3 1200 MG CAPS Take 2,400 mg by mouth 2 (two) times daily.    . sildenafil (VIAGRA) 50 MG tablet Take 50 mg by  mouth as needed for erectile dysfunction.    Alveda Reasons 20 MG TABS tablet TAKE 1 TABLET BY MOUTH ONCE DAILY (Patient taking differently: Take 20 mg by mouth every evening. ) 90 tablet 3  . rosuvastatin (CRESTOR) 20 MG tablet Take 1 tablet (20 mg total) by mouth daily. (Patient taking differently: Take 20 mg by mouth every evening. ) 90 tablet 1   No current facility-administered  medications for this visit.    Allergies:   Penicillins   Social History:  The patient  reports that he quit smoking about 2 years ago. He has a 20.00 pack-year smoking history. He has never used smokeless tobacco. He reports current alcohol use of about 1.0 standard drinks of alcohol per week. He reports that he does not use drugs.   ROS:  Please see the history of present illness.   All other systems are personally reviewed and negative.    Exam:    Vital Signs:  BP 118/72   Pulse 83   Ht 6' (1.829 m)   Wt 262 lb (118.8 kg)   BMI 35.53 kg/m   Well sounding and appearing, alert and conversant, regular work of breathing,  good skin color Eyes- anicteric, neuro- grossly intact, skin- no apparent rash or lesions or cyanosis, mouth- oral mucosa is pink  Labs/Other Tests and Data Reviewed:    Recent Labs: 07/18/2019: BUN 16; Creatinine, Ser 0.83; Hemoglobin 16.2; Platelets 247; Potassium 4.2; Sodium 137   Wt Readings from Last 3 Encounters:  10/21/19 262 lb (118.8 kg)  09/30/19 267 lb 8 oz (121.3 kg)  08/20/19 265 lb 12.8 oz (120.6 kg)     Last device remote is reviewed from [place] PDF which reveals normal device function, no arrhythmias    ASSESSMENT & PLAN:    1.  Persistent afib Well controlled post ablation off AAD therapy No afib by ILR Mostly was unaware of his afib previously chads2vasc score is 3.  He is on xarelto Guidelines would advise not being on both ASA and xarelto.  He states that Dr Virgina Jock recently started his ASA.  I will defer  2. OSA Uses CPAP  3. Prior tachycardia mediated CM EF has improved with sinus  4. HTN Stable No change required today  Follow-up: 3 months with me Follow-up with Dr Virgina Jock as scheduled   Patient Risk:  after full review of this patients clinical status, I feel that they are at moderate risk at this time.  Today, I have spent 15 minutes with the patient with telehealth technology discussing arrhythmia  management .    Army Fossa, MD  10/21/2019 11:14 AM     [facility] [address] [place] [address] [phone] (office) [phone] (fax)

## 2019-11-02 ENCOUNTER — Ambulatory Visit: Payer: 59 | Attending: Internal Medicine

## 2019-11-02 DIAGNOSIS — Z23 Encounter for immunization: Secondary | ICD-10-CM

## 2019-11-02 NOTE — Progress Notes (Signed)
   Covid-19 Vaccination Clinic  Name:  Jose Cain    MRN: [id number] DOB: 12-02-1960  11/02/2019  [name] was observed post Covid-19 immunization for 15 minutes without incident. He was provided with Vaccine Information Sheet and instruction to access the V-Safe system.   [name] was instructed to call 911 with any severe reactions post vaccine: Marland Kitchen Difficulty breathing  . Swelling of face and throat  . A fast heartbeat  . A bad rash all over body  . Dizziness and weakness   Immunizations Administered    Name Date Dose VIS Date Route   Pfizer COVID-19 Vaccine 11/02/2019 10:16 AM 0.3 mL 07/19/2019 Intramuscular   Manufacturer: [employer], [employer]   Lot: [id number]   NDC: [address]-9

## 2019-11-10 ENCOUNTER — Other Ambulatory Visit: Payer: Self-pay | Admitting: Cardiology

## 2019-11-10 DIAGNOSIS — E782 Mixed hyperlipidemia: Secondary | ICD-10-CM

## 2019-11-11 ENCOUNTER — Telehealth: Payer: Self-pay

## 2019-11-11 ENCOUNTER — Ambulatory Visit (INDEPENDENT_AMBULATORY_CARE_PROVIDER_SITE_OTHER): Payer: 59 | Admitting: *Deleted

## 2019-11-11 DIAGNOSIS — I4891 Unspecified atrial fibrillation: Secondary | ICD-10-CM

## 2019-11-11 NOTE — Telephone Encounter (Signed)
Sent 120 mg tab to pharmacy

## 2019-11-11 NOTE — Telephone Encounter (Signed)
Telephone encounter:  Reason for call: Pt's pharmacy is out of 120 mg diltizem capsules. Can we switch him to 120 mg tablets?   Usual provider: [facility]  Last office visit: 09/30/19 Next office visit: 04/03/20   Last hospitalization: NA  Current Outpatient Medications on File Prior to Visit  Medication Sig Dispense Refill  . acetaminophen (TYLENOL) 500 MG tablet Take 500-1,000 mg by mouth every 6 (six) hours as needed (headches/pain.).    Marland Kitchen aspirin EC 81 MG tablet Take 1 tablet (81 mg total) by mouth daily. 90 tablet 3  . diltiazem (CARDIZEM CD) 120 MG 24 hr capsule Take 1 capsule (120 mg total) by mouth daily. 30 capsule 3  . Empagliflozin-metFORMIN HCl (SYNJARDY) 12-998 MG TABS Take 1 tablet by mouth 2 (two) times daily.    Marland Kitchen losartan (COZAAR) 50 MG tablet Take 50 mg by mouth every evening.     . metoprolol succinate (TOPROL XL) 50 MG 24 hr tablet Take 1 tablet (50 mg total) by mouth daily. Take with or immediately following a meal. 90 tablet 3  . Multiple Vitamin (MULTIVITAMIN WITH MINERALS) TABS tablet Take 1 tablet by mouth daily.    . Omega 3 1200 MG CAPS Take 2,400 mg by mouth 2 (two) times daily.    . rosuvastatin (CRESTOR) 20 MG tablet TAKE 1 TABLET BY MOUTH  DAILY 90 tablet 1  . sildenafil (VIAGRA) 50 MG tablet Take 50 mg by mouth as needed for erectile dysfunction.    [name] 20 MG TABS tablet TAKE 1 TABLET BY MOUTH ONCE DAILY (Patient taking differently: Take 20 mg by mouth every evening. ) 90 tablet 3   No current facility-administered medications on file prior to visit.

## 2019-11-11 NOTE — Telephone Encounter (Signed)
If available in the pharmacy, sure.  Thanks MJP

## 2019-11-12 LAB — CUP PACEART REMOTE DEVICE CHECK
Date Time Interrogation Session: 20210401071628
Implantable Pulse Generator Implant Date: 20201012

## 2019-11-18 ENCOUNTER — Other Ambulatory Visit: Payer: Self-pay

## 2019-11-18 MED ORDER — DILTIAZEM HCL ER COATED BEADS 120 MG PO TB24: 120.0000 mg | ORAL_TABLET | Freq: Every day | ORAL | 1 refills | Status: DC

## 2019-11-26 ENCOUNTER — Ambulatory Visit: Payer: 59 | Attending: Internal Medicine

## 2019-11-26 DIAGNOSIS — Z23 Encounter for immunization: Secondary | ICD-10-CM

## 2019-11-26 NOTE — Progress Notes (Signed)
   Covid-19 Vaccination Clinic  Name:  Jose Cain    MRN: [id number] DOB: 04/06/1961  11/26/2019  Mr. Jose Cain was observed post Covid-19 immunization for 15 minutes without incident. He was provided with Vaccine Information Sheet and instruction to access the V-Safe system.   Mr. Jose Cain was instructed to call 911 with any severe reactions post vaccine: Marland Kitchen Difficulty breathing  . Swelling of face and throat  . A fast heartbeat  . A bad rash all over body  . Dizziness and weakness   Immunizations Administered    Name Date Dose VIS Date Route   Pfizer COVID-19 Vaccine 11/26/2019 10:24 AM 0.3 mL 10/02/2018 Intramuscular   Manufacturer: [employer], [employer]   Lot: [id number]   NDC: [address]-4

## 2019-12-12 LAB — CUP PACEART REMOTE DEVICE CHECK
Date Time Interrogation Session: 20210506074140
Implantable Pulse Generator Implant Date: 20201012

## 2019-12-16 ENCOUNTER — Ambulatory Visit (INDEPENDENT_AMBULATORY_CARE_PROVIDER_SITE_OTHER): Payer: 59 | Admitting: *Deleted

## 2019-12-16 DIAGNOSIS — I4892 Unspecified atrial flutter: Secondary | ICD-10-CM | POA: Diagnosis not present

## 2019-12-16 DIAGNOSIS — I4891 Unspecified atrial fibrillation: Secondary | ICD-10-CM | POA: Diagnosis not present

## 2019-12-16 NOTE — Progress Notes (Signed)
Carelink Summary Report / Loop Recorder 

## 2019-12-19 ENCOUNTER — Other Ambulatory Visit: Payer: Self-pay

## 2019-12-19 MED ORDER — DILTIAZEM HCL ER COATED BEADS 120 MG PO TB24: 120.0000 mg | ORAL_TABLET | Freq: Every day | ORAL | 1 refills | Status: DC

## 2019-12-25 ENCOUNTER — Other Ambulatory Visit: Payer: Self-pay

## 2019-12-25 DIAGNOSIS — I4892 Unspecified atrial flutter: Secondary | ICD-10-CM

## 2019-12-25 MED ORDER — DILTIAZEM HCL ER COATED BEADS 120 MG PO TB24: 120.0000 mg | ORAL_TABLET | Freq: Every day | ORAL | 1 refills | Status: DC

## 2019-12-27 ENCOUNTER — Other Ambulatory Visit: Payer: Self-pay

## 2019-12-27 MED ORDER — DILTIAZEM HCL ER COATED BEADS 120 MG PO CP24: 120.0000 mg | ORAL_CAPSULE | Freq: Every day | ORAL | 1 refills | Status: DC

## 2020-01-20 ENCOUNTER — Ambulatory Visit: Payer: 59 | Admitting: Internal Medicine

## 2020-01-20 ENCOUNTER — Ambulatory Visit (INDEPENDENT_AMBULATORY_CARE_PROVIDER_SITE_OTHER): Payer: 59 | Admitting: *Deleted

## 2020-01-20 DIAGNOSIS — I4891 Unspecified atrial fibrillation: Secondary | ICD-10-CM

## 2020-01-20 LAB — CUP PACEART REMOTE DEVICE CHECK
Date Time Interrogation Session: 20210614103637
Implantable Pulse Generator Implant Date: 20201012

## 2020-01-20 NOTE — Progress Notes (Signed)
Carelink Summary Report / Loop Recorder 

## 2020-01-21 ENCOUNTER — Telehealth: Payer: Self-pay

## 2020-01-21 NOTE — Telephone Encounter (Signed)
Spoke with pt regarding virtual visit on 01/22/20. Pt stated he did not have any questions at this time and confirmed virtual appt.

## 2020-01-22 ENCOUNTER — Encounter: Payer: Self-pay | Admitting: Internal Medicine

## 2020-01-22 ENCOUNTER — Telehealth (INDEPENDENT_AMBULATORY_CARE_PROVIDER_SITE_OTHER): Payer: 59 | Admitting: Internal Medicine

## 2020-01-22 ENCOUNTER — Other Ambulatory Visit: Payer: Self-pay

## 2020-01-22 VITALS — BP 120/77 | HR 86 | Ht 72.0 in | Wt 266.0 lb

## 2020-01-22 DIAGNOSIS — G4733 Obstructive sleep apnea (adult) (pediatric): Secondary | ICD-10-CM | POA: Diagnosis not present

## 2020-01-22 DIAGNOSIS — D6869 Other thrombophilia: Secondary | ICD-10-CM | POA: Diagnosis not present

## 2020-01-22 DIAGNOSIS — I4819 Other persistent atrial fibrillation: Secondary | ICD-10-CM | POA: Diagnosis not present

## 2020-01-22 NOTE — Progress Notes (Signed)
Electrophysiology TeleHealth Note   Due to national recommendations of social distancing due to COVID 19, an audio/video telehealth visit is felt to be most appropriate for this patient at this time.  See MyChart message from today for the patient's consent to telehealth for [facility].  Date:  01/22/2020   ID:  Jose Cain, DOB [birth year], MRN [id number]  Location: patient's home  Provider location:  [place]  Evaluation Performed: Follow-up visit  PCP:  Nickola Major, MD   Electrophysiologist:  Dr Rayann Heman  Chief Complaint:  palpitations  History of Present Illness:    Jose Cain is a 59 y.o. male who presents via telehealth conferencing today.  Since last being seen in our clinic, the patient reports doing very well.  Today, he denies symptoms of palpitations, chest pain, shortness of breath,  lower extremity edema, dizziness, presyncope, or syncope.  The patient is otherwise without complaint today.   Past Medical History:  Diagnosis Date  . Atypical atrial flutter ([place])   . Cardiomyopathy ([place])   . 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 Diabetes mellitus without complication ([place])   . Diabetic retinopathy ([place])   . Hypertension   . Kidney stone   . Mixed hyperlipidemia   . Nephrolithiasis   . Obesity   . OSA (obstructive sleep apnea)   . Persistent atrial fibrillation [facility])     Past Surgical History:  Procedure Laterality Date  . ATRIAL FIBRILLATION ABLATION N/A 01/30/2018   Procedure: ATRIAL FIBRILLATION ABLATION;  Surgeon: Thompson Grayer, MD;  Location: [place] CV LAB;  Service: Cardiovascular;  Laterality: N/A;  . ATRIAL FIBRILLATION ABLATION N/A 07/23/2019   Procedure: ATRIAL FIBRILLATION ABLATION;  Surgeon: Thompson Grayer, MD;  Location: [place] CV LAB;  Service: Cardiovascular;  Laterality: N/A;  . CARDIOVERSION N/A 12/12/2017   Procedure: CARDIOVERSION;  Surgeon: Nigel Mormon, MD;  Location: [place] ENDOSCOPY;  Service:  Cardiovascular;  Laterality: N/A;  . CARDIOVERSION N/A 01/08/2018   Procedure: CARDIOVERSION;  Surgeon: Nigel Mormon, MD;  Location: [place] ENDOSCOPY;  Service: Cardiovascular;  Laterality: N/A;  . CARDIOVERSION N/A 06/11/2019   Procedure: CARDIOVERSION;  Surgeon: Nigel Mormon, MD;  Location: [facility] ENDOSCOPY;  Service: Cardiovascular;  Laterality: N/A;  . implantable loop recorder implantation  05/20/2019   Medtronic Reveal Linq model LNQ22 (SN [id number]) implantable loop recorder implanted by Dr Rayann Heman for afib management post ablation  . LITHOTRIPSY    . TEE WITHOUT CARDIOVERSION N/A 12/12/2017   Procedure: TRANSESOPHAGEAL ECHOCARDIOGRAM (TEE);  Surgeon: Nigel Mormon, MD;  Location: [facility] ENDOSCOPY;  Service: Cardiovascular;  Laterality: N/A;    Current Outpatient Medications  Medication Sig Dispense Refill  . acetaminophen (TYLENOL) 500 MG tablet Take 500-1,000 mg by mouth every 6 (six) hours as needed (headches/pain.).    Marland Kitchen aspirin EC 81 MG tablet Take 1 tablet (81 mg total) by mouth daily. 90 tablet 3  . diltiazem (CARDIZEM CD) 120 MG 24 hr capsule Take 1 capsule (120 mg total) by mouth daily. 90 capsule 1  . Empagliflozin-metFORMIN HCl (SYNJARDY) 12-998 MG TABS Take 1 tablet by mouth 2 (two) times daily.    Marland Kitchen losartan (COZAAR) 50 MG tablet Take 50 mg by mouth every evening.     . metoprolol succinate (TOPROL XL) 50 MG 24 hr tablet Take 1 tablet (50 mg total) by mouth daily. Take with or immediately following a meal. 90 tablet 3  . Multiple Vitamin (MULTIVITAMIN WITH MINERALS) TABS tablet Take 1 tablet by mouth  daily.    . Omega 3 1200 MG CAPS Take 2,400 mg by mouth 2 (two) times daily.    . rosuvastatin (CRESTOR) 20 MG tablet TAKE 1 TABLET BY MOUTH  DAILY 90 tablet 1  . sildenafil (VIAGRA) 50 MG tablet Take 50 mg by mouth as needed for erectile dysfunction.    [name] 20 MG TABS tablet TAKE 1 TABLET BY MOUTH ONCE DAILY 90 tablet 3   No current facility-administered  medications for this visit.    Allergies:   Penicillins   Social History:  The patient  reports that he quit smoking about 2 years ago. He has a 20.00 pack-year smoking history. He has never used smokeless tobacco. He reports current alcohol use of about 1.0 standard drink of alcohol per week. He reports that he does not use drugs.   ROS:  Please see the history of present illness.   All other systems are personally reviewed and negative.    Exam:    Vital Signs:  BP 120/77   Pulse 86   Ht 6' (1.829 m)   Wt 266 lb (120.7 kg)   BMI 36.08 kg/m   Well sounding and appearing, alert and conversant, regular work of breathing,  good skin color Eyes- anicteric, neuro- grossly intact, skin- no apparent rash or lesions or cyanosis, mouth- oral mucosa is pink  Labs/Other Tests and Data Reviewed:    Recent Labs: 07/18/2019: BUN 16; Creatinine, Ser 0.83; Hemoglobin 16.2; Platelets 247; Potassium 4.2; Sodium 137   Wt Readings from Last 3 Encounters:  01/22/20 266 lb (120.7 kg)  10/21/19 262 lb (118.8 kg)  09/30/19 267 lb 8 oz (121.3 kg)     Last device remote is reviewed from [facility] PDF which reveals normal device function, no arrhythmias    ASSESSMENT & PLAN:    1.  Persistent afib Resolved post ablation (no afib by ILR) chads2vasc sore is 2 (HTN,DM)  He is on xarelto I would stop his ASA however he states that Dr Rosemary Holms would like him on both anticoagulants.  I will defer.  2. OSA Uses CPAP  3. HTN Stable No change required today  4. Prior tachycardia mediate CM resolved with sinus  5. CAD Nonobstructive Consider stopping asa as above No ischemic symptoms   Risks, benefits and potential toxicities for medications prescribed and/or refilled reviewed with patient today.   Follow-up:  With Dr Rosemary Holms as scheduled I will see in a year   Patient Risk:  after full review of this patients clinical status, I feel that they are at moderate risk at this  time.  Today, I have spent 15 minutes with the patient with telehealth technology discussing arrhythmia management .    Randolm Idol, MD  01/22/2020 2:49 PM     [facility] [address] [place] [address] [phone] (office) [phone] (fax)

## 2020-02-20 ENCOUNTER — Ambulatory Visit (INDEPENDENT_AMBULATORY_CARE_PROVIDER_SITE_OTHER): Payer: 59 | Admitting: *Deleted

## 2020-02-20 DIAGNOSIS — I4892 Unspecified atrial flutter: Secondary | ICD-10-CM

## 2020-02-20 DIAGNOSIS — I4891 Unspecified atrial fibrillation: Secondary | ICD-10-CM | POA: Diagnosis not present

## 2020-02-20 LAB — CUP PACEART REMOTE DEVICE CHECK
Date Time Interrogation Session: 20210715080723
Implantable Pulse Generator Implant Date: 20201012

## 2020-02-21 NOTE — Progress Notes (Signed)
Carelink Summary Report / Loop Recorder 

## 2020-03-23 ENCOUNTER — Ambulatory Visit (INDEPENDENT_AMBULATORY_CARE_PROVIDER_SITE_OTHER): Payer: 59 | Admitting: *Deleted

## 2020-03-23 DIAGNOSIS — I4892 Unspecified atrial flutter: Secondary | ICD-10-CM

## 2020-03-23 DIAGNOSIS — I4891 Unspecified atrial fibrillation: Secondary | ICD-10-CM

## 2020-03-25 LAB — CUP PACEART REMOTE DEVICE CHECK
Date Time Interrogation Session: 20210817061454
Implantable Pulse Generator Implant Date: 20201012

## 2020-03-25 NOTE — Progress Notes (Signed)
Carelink Summary Report / Loop Recorder 

## 2020-03-29 ENCOUNTER — Other Ambulatory Visit: Payer: Self-pay | Admitting: Internal Medicine

## 2020-04-03 ENCOUNTER — Other Ambulatory Visit: Payer: Self-pay

## 2020-04-03 ENCOUNTER — Ambulatory Visit: Payer: 59 | Admitting: Cardiology

## 2020-04-03 ENCOUNTER — Encounter: Payer: Self-pay | Admitting: Cardiology

## 2020-04-03 ENCOUNTER — Other Ambulatory Visit: Payer: Self-pay | Admitting: Cardiology

## 2020-04-03 VITALS — BP 135/87 | HR 80 | Resp 16 | Ht 72.0 in | Wt 271.0 lb

## 2020-04-03 DIAGNOSIS — I2510 Atherosclerotic heart disease of native coronary artery without angina pectoris: Secondary | ICD-10-CM

## 2020-04-03 DIAGNOSIS — I10 Essential (primary) hypertension: Secondary | ICD-10-CM

## 2020-04-03 DIAGNOSIS — R931 Abnormal findings on diagnostic imaging of heart and coronary circulation: Secondary | ICD-10-CM

## 2020-04-03 DIAGNOSIS — I4892 Unspecified atrial flutter: Secondary | ICD-10-CM

## 2020-04-03 DIAGNOSIS — E782 Mixed hyperlipidemia: Secondary | ICD-10-CM

## 2020-04-03 DIAGNOSIS — G4733 Obstructive sleep apnea (adult) (pediatric): Secondary | ICD-10-CM

## 2020-04-03 MED ORDER — ROSUVASTATIN CALCIUM 20 MG PO TABS: 20.0000 mg | ORAL_TABLET | Freq: Every day | ORAL | 3 refills | Status: DC

## 2020-04-03 NOTE — Progress Notes (Signed)
Subjective:   @Patient  ID: Jose Cain, male    DOB: 09/30/1960, 59 y.o.   MRN: [id number]   Chief Complaint  Patient presents with   Atrial Fibrillation   Coronary Artery Disease   Follow-up    HPI  83 Caucasian male with hypertension, controlled type II diabetes mellitus, moderate obesity, OSA on CPAP, paroxysmal Afib/flutter.  Patient underwent second ablation by Dr. Rayann Heman on 07/23/2019. Pre ablation CT scan showed elevated calcium score of 508 involving all 3 major epicardial vessels. Subsequent Lexiscan stress test showed medium sized, mild intensity, predominantly reversible, apical to basal, inferior/inferolateral perfusion defect, suggesting ischemia in LCx/PDA territory, with normal LVEF 57%.  He currently walks about 3 miles/an hour 30 min everyday, 5 days a week, and also does some other exercises. With this level of activity, he does not have any chest pain, shortness of breath symptoms..  Patient is already on Xarelto 20 mg daily, without any bleeding.  He had colonoscopy earlier in 2020, had one polyp removed.  However, he has never had any melena.  Hemoglobin is normal.  Recent lipid panel reviewed with the patient, details below.    Current Outpatient Medications on File Prior to Visit  Medication Sig Dispense Refill   acetaminophen (TYLENOL) 500 MG tablet Take 500-1,000 mg by mouth every 6 (six) hours as needed (headches/pain.).     aspirin EC 81 MG tablet Take 1 tablet (81 mg total) by mouth daily. 90 tablet 3   diltiazem (CARDIZEM CD) 120 MG 24 hr capsule Take 1 capsule (120 mg total) by mouth daily. 90 capsule 1   Empagliflozin-metFORMIN HCl (SYNJARDY) 12-998 MG TABS Take 1 tablet by mouth 2 (two) times daily.     losartan (COZAAR) 50 MG tablet Take 50 mg by mouth every evening.      metoprolol succinate (TOPROL-XL) 50 MG 24 hr tablet TAKE 1 TABLET BY MOUTH  DAILY WITH OR IMMEDIATELY  FOLLOWING A MEAL 90 tablet 2   Multiple Vitamin (MULTIVITAMIN  WITH MINERALS) TABS tablet Take 1 tablet by mouth daily.     Omega 3 1200 MG CAPS Take 2,400 mg by mouth 2 (two) times daily.     rosuvastatin (CRESTOR) 20 MG tablet TAKE 1 TABLET BY MOUTH  DAILY 90 tablet 1   sildenafil (VIAGRA) 50 MG tablet Take 50 mg by mouth as needed for erectile dysfunction.     XARELTO 20 MG TABS tablet TAKE 1 TABLET BY MOUTH ONCE DAILY 90 tablet 3   No current facility-administered medications on file prior to visit.    Cardiovascular studies:   EKG 04/03/2020: Sinus rhythm 81 bpm First degree AV block  Right bundle branch block Low voltage precoridal leads No significant change compared to previous EKG  Lexiscan Sestamibi stress test 08/07/2019: No previous exam available for comparison. Lexiscan/walking nuclear stress test performed using 1-day protocol. Rest and stress EKG show sinus rhythm/tachycardia, RBBB, LAFB.  SPECT images show medium sized, mild intensity, predominantly reversible, apical to basal, inferior/inferolateral perfusion defect, suggesting ischemia in LCx/PDA territory.  Stres LVEF 57%. Intermediate risk study.   CT Chest 07/19/2019: 1.  Moderate bi atrial enlargement No LAA thrombus 2.  Normal PV anatomy measurements above 3. Calcium score 508 involving all 3 major epicardial vessels consider f/u perfusion study. 4.  Normal aortic root 3.6 cm 5.  No ASD/PFO 6.  No pericardial effusion  EKG 06/03/2019: Atrial fibrillation 141 bpm.  Diffuse low voltage.  Right bundle branch block.  Left posterior fascicular  block. Diffuse nonspecific T-abnormality.    Recent labs: 04/01/2020: H/H 15/45. MCV 95. Platelets 253 Chol 102, TG 330, HDL 30, direct LDL 48 HbA1C 6.9%  09/24/2019: Chol 98, TG 175, HDL 29, LDL 40  07/18/2019: Glucose 131, BUN/Cr 16/0.83. EGFR 98. Na/K 137/4.2. Rest of the CMP normal H/H 16/46. MCV 94. Platelets 247  03/27/2019: Chol 170, TG 315, HDL 30, LDL 125  02/16/2018:  HbA1c 6.9%.     Review of  Systems  Cardiovascular: Positive for leg swelling (Occasional). Negative for chest pain, dyspnea on exertion, palpitations and syncope.       Objective:    Vitals:   04/03/20 0931  BP: 135/87  Pulse: 80  Resp: 16  SpO2: 98%     Physical Exam Vitals and nursing note reviewed.  Constitutional:      General: He is not in acute distress.    Appearance: He is well-developed.  Neck:     Vascular: No JVD.  Cardiovascular:     Rate and Rhythm: Normal rate and regular rhythm.     Pulses: Intact distal pulses.     Heart sounds: Normal heart sounds. No murmur heard.   Pulmonary:     Effort: Pulmonary effort is normal.     Breath sounds: Normal breath sounds. No wheezing or rales.  Neurological:     Mental Status: He is alert and oriented to person, place, and time.         Assessment & Recommendations:    60 Caucasian male with hypertension, controlled type II diabetes mellitus, CAD, moderate obesity, OSA on CPAP, paroxysmal Afib/flutter s/p ablation  CAD without angina: Calcium score 508.SPECT images show medium sized, mild intensity, predominantly reversible, apical to basal, inferior/inferolateral perfusion defect, suggesting ischemia in LCx/PDA territory.  Patient is completely asymptomatic at this time. LVEF is normal. Therefore, I would continue aggressive medical management. In absence of bleeding, added Aspirin 81 mg daily. Discussed risks of bleeding while on Xarelto. Avoid NSAIDS. LDL 48, but TG 330. Repeat fasting lipid panel in 1 month. Recommend low carbohydrae diet. If TG remains >200, will consider adding Vascepa. Continue metoprolol, diltiazem, losartan at current doses Continue Synjardy, which has empaglifozin.  Paroxysmal Afib/flutter: S/p repeat ablation by Dr. Rayann Heman in 07/2019. Maintaining sinus rhythm. Loop recorder followed by Dr. Rayann Heman.  CHA2DS2VAsc score 3 with h/o heart failure. Continue Xarelto 20 mg once daily.  OSA: Continue  CPAP  Arrhythmia induced cardiomyopathy: Resolved after successful atrial fibrillation, flutter ablation. EF of 55%.  Dyslipidemia: As above.  Hypertension: Controlled.   F/u in 6 months  Danissa Rundle Esther Hardy, MD [facility] [place] Pager: [phone] Office: [phone] If no answer Cell [phone]

## 2020-04-23 ENCOUNTER — Ambulatory Visit (INDEPENDENT_AMBULATORY_CARE_PROVIDER_SITE_OTHER): Payer: 59 | Admitting: *Deleted

## 2020-04-23 DIAGNOSIS — I4892 Unspecified atrial flutter: Secondary | ICD-10-CM

## 2020-04-23 DIAGNOSIS — I4891 Unspecified atrial fibrillation: Secondary | ICD-10-CM

## 2020-04-27 LAB — CUP PACEART REMOTE DEVICE CHECK
Date Time Interrogation Session: 20210918114525
Implantable Pulse Generator Implant Date: 20201012

## 2020-04-28 NOTE — Progress Notes (Signed)
Carelink Summary Report / Loop Recorder 

## 2020-05-15 ENCOUNTER — Other Ambulatory Visit: Payer: Self-pay | Admitting: Cardiology

## 2020-05-15 DIAGNOSIS — E782 Mixed hyperlipidemia: Secondary | ICD-10-CM

## 2020-05-15 LAB — LIPID PANEL
Chol/HDL Ratio: 3.4 ratio (ref 0.0–5.0)
Cholesterol, Total: 103 mg/dL (ref 100–199)
HDL: 30 mg/dL — ABNORMAL LOW (ref >39)
LDL Chol Calc (NIH): 34 mg/dL (ref 0–99)
Triglycerides: 255 mg/dL — ABNORMAL HIGH (ref 0–149)
VLDL Cholesterol Cal: 39 mg/dL (ref 5–40)

## 2020-05-25 ENCOUNTER — Ambulatory Visit (INDEPENDENT_AMBULATORY_CARE_PROVIDER_SITE_OTHER): Payer: 59

## 2020-05-25 DIAGNOSIS — I4891 Unspecified atrial fibrillation: Secondary | ICD-10-CM

## 2020-05-29 LAB — CUP PACEART REMOTE DEVICE CHECK
Date Time Interrogation Session: 20211022135604
Implantable Pulse Generator Implant Date: 20201012

## 2020-06-01 NOTE — Progress Notes (Signed)
Carelink Summary Report / Loop Recorder 

## 2020-06-09 ENCOUNTER — Telehealth: Payer: Self-pay

## 2020-06-09 MED ORDER — DILTIAZEM HCL ER COATED BEADS 120 MG PO CP24: 120.0000 mg | ORAL_CAPSULE | Freq: Every day | ORAL | 1 refills | Status: DC

## 2020-06-24 NOTE — Telephone Encounter (Signed)
error 

## 2020-07-01 ENCOUNTER — Ambulatory Visit (INDEPENDENT_AMBULATORY_CARE_PROVIDER_SITE_OTHER): Payer: 59

## 2020-07-01 DIAGNOSIS — I4891 Unspecified atrial fibrillation: Secondary | ICD-10-CM

## 2020-07-03 LAB — CUP PACEART REMOTE DEVICE CHECK
Date Time Interrogation Session: 20211126061248
Implantable Pulse Generator Implant Date: 20201012

## 2020-07-10 NOTE — Progress Notes (Signed)
Carelink Summary Report / Loop Recorder 

## 2020-07-29 ENCOUNTER — Other Ambulatory Visit: Payer: Self-pay

## 2020-07-29 ENCOUNTER — Encounter (INDEPENDENT_AMBULATORY_CARE_PROVIDER_SITE_OTHER): Payer: 59 | Admitting: Ophthalmology

## 2020-07-29 DIAGNOSIS — I10 Essential (primary) hypertension: Secondary | ICD-10-CM

## 2020-07-29 DIAGNOSIS — E113293 Type 2 diabetes mellitus with mild nonproliferative diabetic retinopathy without macular edema, bilateral: Secondary | ICD-10-CM

## 2020-07-29 DIAGNOSIS — H2513 Age-related nuclear cataract, bilateral: Secondary | ICD-10-CM

## 2020-07-29 DIAGNOSIS — H35033 Hypertensive retinopathy, bilateral: Secondary | ICD-10-CM | POA: Diagnosis not present

## 2020-07-29 DIAGNOSIS — H43813 Vitreous degeneration, bilateral: Secondary | ICD-10-CM

## 2020-08-03 ENCOUNTER — Ambulatory Visit (INDEPENDENT_AMBULATORY_CARE_PROVIDER_SITE_OTHER): Payer: 59

## 2020-08-03 DIAGNOSIS — I4891 Unspecified atrial fibrillation: Secondary | ICD-10-CM

## 2020-08-05 LAB — CUP PACEART REMOTE DEVICE CHECK
Date Time Interrogation Session: 20211227055635
Implantable Pulse Generator Implant Date: 20201012

## 2020-08-17 NOTE — Progress Notes (Signed)
Carelink Summary Report / Loop Recorder 

## 2020-09-03 ENCOUNTER — Ambulatory Visit (INDEPENDENT_AMBULATORY_CARE_PROVIDER_SITE_OTHER): Payer: 59

## 2020-09-03 DIAGNOSIS — I429 Cardiomyopathy, unspecified: Secondary | ICD-10-CM

## 2020-09-05 LAB — CUP PACEART REMOTE DEVICE CHECK
Date Time Interrogation Session: 20220129073840
Implantable Pulse Generator Implant Date: 20201012

## 2020-09-13 NOTE — Progress Notes (Signed)
Carelink Summary Report / Loop Recorder 

## 2020-09-30 ENCOUNTER — Other Ambulatory Visit: Payer: Self-pay

## 2020-09-30 ENCOUNTER — Encounter: Payer: Self-pay | Admitting: Cardiology

## 2020-09-30 ENCOUNTER — Ambulatory Visit: Payer: 59 | Admitting: Cardiology

## 2020-09-30 VITALS — BP 132/82 | HR 76 | Temp 98.2°F | Resp 16 | Ht 72.0 in | Wt 264.0 lb

## 2020-09-30 DIAGNOSIS — I4892 Unspecified atrial flutter: Secondary | ICD-10-CM

## 2020-09-30 DIAGNOSIS — E782 Mixed hyperlipidemia: Secondary | ICD-10-CM

## 2020-09-30 DIAGNOSIS — I2510 Atherosclerotic heart disease of native coronary artery without angina pectoris: Secondary | ICD-10-CM

## 2020-09-30 DIAGNOSIS — I480 Paroxysmal atrial fibrillation: Secondary | ICD-10-CM

## 2020-09-30 DIAGNOSIS — I10 Essential (primary) hypertension: Secondary | ICD-10-CM

## 2020-09-30 NOTE — Progress Notes (Signed)
Subjective:   @Patient  ID: Jose Cain, male    DOB: 06-18-1961, 60 y.o.   MRN: [id number]   Chief Complaint  Patient presents with  . Coronary Artery Disease  . Atrial Fibrillation  . Follow-up    6 month     HPI  54 Caucasian male with hypertension, controlled type II diabetes mellitus, moderate obesity, OSA on CPAP, paroxysmal Afib/flutter.  Patient underwent second ablation by Dr. Rayann Heman on 07/23/2019. Pre ablation CT scan showed elevated calcium score of 508 involving all 3 major epicardial vessels. Subsequent Lexiscan stress test showed medium sized, mild intensity, predominantly reversible, apical to basal, inferior/inferolateral perfusion defect, suggesting ischemia in LCx/PDA territory, with normal LVEF 57%.  Patient is doing well, denies chest pain, shortness of breath, palpitations, orthopnea, PND, TIA/syncope.  Reports leg edema, bolus in the evenings, better in the mornings.  He is trying to lose weight, has lost 4 pounds since his last visit.  He has upcoming labs with his PCP next week.   Current Outpatient Medications on File Prior to Visit  Medication Sig Dispense Refill  . acetaminophen (TYLENOL) 500 MG tablet Take 500-1,000 mg by mouth every 6 (six) hours as needed (headches/pain.).    Marland Kitchen aspirin EC 81 MG tablet Take 1 tablet (81 mg total) by mouth daily. 90 tablet 3  . diltiazem (CARDIZEM CD) 120 MG 24 hr capsule Take 1 capsule (120 mg total) by mouth daily. 90 capsule 1  . Empagliflozin-metFORMIN HCl (SYNJARDY) 12-998 MG TABS Take 1 tablet by mouth 2 (two) times daily.    Marland Kitchen losartan (COZAAR) 50 MG tablet Take 50 mg by mouth every evening.     . metoprolol succinate (TOPROL-XL) 50 MG 24 hr tablet TAKE 1 TABLET BY MOUTH  DAILY WITH OR IMMEDIATELY  FOLLOWING A MEAL 90 tablet 2  . Multiple Vitamin (MULTIVITAMIN WITH MINERALS) TABS tablet Take 1 tablet by mouth daily.    . Omega 3 1200 MG CAPS Take 2,400 mg by mouth 2 (two) times daily.    . rosuvastatin  (CRESTOR) 20 MG tablet Take 1 tablet (20 mg total) by mouth daily. 90 tablet 3  . sildenafil (VIAGRA) 50 MG tablet Take 50 mg by mouth as needed for erectile dysfunction.    Alveda Reasons 20 MG TABS tablet TAKE 1 TABLET BY MOUTH ONCE DAILY 90 tablet 3   No current facility-administered medications on file prior to visit.    Cardiovascular studies:   EKG 09/30/2020: Sinus rhythm 77 bpm Right bundle branch block  Old inferior infarct  Lexiscan Sestamibi stress test 08/07/2019: No previous exam available for comparison. Lexiscan/walking nuclear stress test performed using 1-day protocol. Rest and stress EKG show sinus rhythm/tachycardia, RBBB, LAFB.  SPECT images show medium sized, mild intensity, predominantly reversible, apical to basal, inferior/inferolateral perfusion defect, suggesting ischemia in LCx/PDA territory.  Stres LVEF 57%. Intermediate risk study.   CT Chest 07/19/2019: 1.  Moderate bi atrial enlargement No LAA thrombus 2.  Normal PV anatomy measurements above 3. Calcium score 508 involving all 3 major epicardial vessels consider f/u perfusion study. 4.  Normal aortic root 3.6 cm 5.  No ASD/PFO 6.  No pericardial effusion  EKG 06/03/2019: Atrial fibrillation 141 bpm.  Diffuse low voltage.  Right bundle branch block.  Left posterior fascicular block. Diffuse nonspecific T-abnormality.    Recent labs: 05/14/2020: Chol 103 TG 255, HDL 30, LDL 34  04/01/2020: H/H 15/45. MCV 95. Platelets 253 Chol 102, TG 330, HDL 30, direct LDL 48  HbA1C 6.9%  09/24/2019: Chol 98, TG 175, HDL 29, LDL 40  07/18/2019: Glucose 131, BUN/Cr 16/0.83. EGFR 98. Na/K 137/4.2. Rest of the CMP normal H/H 16/46. MCV 94. Platelets 247  03/27/2019: Chol 170, TG 315, HDL 30, LDL 125  02/16/2018:  HbA1c 6.9%.     Review of Systems  Cardiovascular: Positive for leg swelling (Occasional). Negative for chest pain, dyspnea on exertion, palpitations and syncope.       Objective:     Vitals:   09/30/20 0923  BP: 132/82  Pulse: 76  Resp: 16  Temp: 98.2 F (36.8 C)  SpO2: 100%     Physical Exam Vitals and nursing note reviewed.  Constitutional:      General: He is not in acute distress.    Appearance: He is well-developed.  Neck:     Vascular: No JVD.  Cardiovascular:     Rate and Rhythm: Normal rate and regular rhythm.     Pulses: Intact distal pulses.     Heart sounds: Normal heart sounds. No murmur heard.   Pulmonary:     Effort: Pulmonary effort is normal.     Breath sounds: Normal breath sounds. No wheezing or rales.  Neurological:     Mental Status: He is alert and oriented to person, place, and time.         Assessment & Recommendations:    22 Caucasian male with hypertension, controlled type II diabetes mellitus, CAD, moderate obesity, OSA on CPAP, paroxysmal Afib/flutter s/p ablation  CAD without angina: Calcium score 508.SPECT images show medium sized, mild intensity, predominantly reversible, apical to basal, inferior/inferolateral perfusion defect, suggesting ischemia in LCx/PDA territory.  Patient is completely asymptomatic at this time. LVEF is normal. Therefore, I would continue aggressive medical management. He has tolerated aspirin and Xarelto well, with is not followed up for his colonoscopies.  He has had a history of at least one polyp in the past..  Aspirin. Similarly can obtain lipid management. Continue metoprolol, diltiazem, losartan at current doses Continue Synjardy, which has empaglifozin.  Paroxysmal Afib/flutter: S/p repeat ablation by Dr. Rayann Heman in 07/2019. Maintaining sinus rhythm. Loop recorder followed by Dr. Rayann Heman.  CHA2DS2VAsc score 2, annual stroke risk 2%. Continue Xarelto 20 mg once daily.  Mixed hyperlipidemia: LDL well controlled.  Triglyceride 255.  Is reluctant to start Vascepa.  Would like to continue diet lifestyle modifications, especially weight loss and carbohydrate reduction.  OSA: Continue  CPAP  Arrhythmia induced cardiomyopathy: Resolved after successful atrial fibrillation, flutter ablation. EF of 55%.  Hypertension: Controlled.   F/u in 1 year   Nigel Mormon, MD [facility] [address] [facility] [place] Pager: [phone] Office: [phone] If no answer Cell [phone]

## 2020-10-05 ENCOUNTER — Ambulatory Visit (INDEPENDENT_AMBULATORY_CARE_PROVIDER_SITE_OTHER): Payer: 59

## 2020-10-05 DIAGNOSIS — I429 Cardiomyopathy, unspecified: Secondary | ICD-10-CM

## 2020-10-08 LAB — CUP PACEART REMOTE DEVICE CHECK
Date Time Interrogation Session: 20220303064232
Implantable Pulse Generator Implant Date: 20201012

## 2020-10-12 NOTE — Progress Notes (Signed)
Carelink Summary Report / Loop Recorder 

## 2020-11-05 ENCOUNTER — Ambulatory Visit (INDEPENDENT_AMBULATORY_CARE_PROVIDER_SITE_OTHER): Payer: 59

## 2020-11-05 DIAGNOSIS — I429 Cardiomyopathy, unspecified: Secondary | ICD-10-CM | POA: Diagnosis not present

## 2020-11-10 LAB — CUP PACEART REMOTE DEVICE CHECK
Date Time Interrogation Session: 20220405071838
Implantable Pulse Generator Implant Date: 20201012

## 2020-11-17 NOTE — Progress Notes (Signed)
Carelink Summary Report / Loop Recorder 

## 2020-11-23 ENCOUNTER — Other Ambulatory Visit: Payer: Self-pay | Admitting: Cardiology

## 2020-12-07 ENCOUNTER — Ambulatory Visit (INDEPENDENT_AMBULATORY_CARE_PROVIDER_SITE_OTHER): Payer: 59

## 2020-12-07 DIAGNOSIS — I429 Cardiomyopathy, unspecified: Secondary | ICD-10-CM | POA: Diagnosis not present

## 2020-12-16 LAB — CUP PACEART REMOTE DEVICE CHECK
Date Time Interrogation Session: 20220507070048
Implantable Pulse Generator Implant Date: 20201012

## 2020-12-24 NOTE — Progress Notes (Signed)
Carelink Summary Report / Loop Recorder 

## 2020-12-30 ENCOUNTER — Other Ambulatory Visit: Payer: Self-pay

## 2020-12-30 MED ORDER — DILTIAZEM HCL ER COATED BEADS 120 MG PO CP24: 120.0000 mg | ORAL_CAPSULE | Freq: Every day | ORAL | 3 refills | Status: DC

## 2021-01-26 ENCOUNTER — Other Ambulatory Visit: Payer: Self-pay | Admitting: Internal Medicine

## 2021-02-15 ENCOUNTER — Ambulatory Visit (INDEPENDENT_AMBULATORY_CARE_PROVIDER_SITE_OTHER): Payer: 59

## 2021-02-15 DIAGNOSIS — I429 Cardiomyopathy, unspecified: Secondary | ICD-10-CM | POA: Diagnosis not present

## 2021-02-18 LAB — CUP PACEART REMOTE DEVICE CHECK
Date Time Interrogation Session: 20220714064600
Implantable Pulse Generator Implant Date: 20201012

## 2021-03-08 NOTE — Progress Notes (Signed)
Carelink Summary Report / Loop Recorder 

## 2021-03-18 ENCOUNTER — Ambulatory Visit (INDEPENDENT_AMBULATORY_CARE_PROVIDER_SITE_OTHER): Payer: 59

## 2021-03-18 DIAGNOSIS — I480 Paroxysmal atrial fibrillation: Secondary | ICD-10-CM | POA: Diagnosis not present

## 2021-03-19 ENCOUNTER — Other Ambulatory Visit: Payer: Self-pay | Admitting: Internal Medicine

## 2021-03-23 LAB — CUP PACEART REMOTE DEVICE CHECK
Date Time Interrogation Session: 20220815171711
Implantable Pulse Generator Implant Date: 20201012

## 2021-03-24 ENCOUNTER — Other Ambulatory Visit: Payer: Self-pay | Admitting: Cardiology

## 2021-03-24 ENCOUNTER — Other Ambulatory Visit: Payer: Self-pay | Admitting: Internal Medicine

## 2021-03-24 DIAGNOSIS — E782 Mixed hyperlipidemia: Secondary | ICD-10-CM

## 2021-04-08 NOTE — Progress Notes (Signed)
Carelink Summary Report / Loop Recorder 

## 2021-04-19 ENCOUNTER — Ambulatory Visit (INDEPENDENT_AMBULATORY_CARE_PROVIDER_SITE_OTHER): Payer: 59

## 2021-04-19 DIAGNOSIS — I480 Paroxysmal atrial fibrillation: Secondary | ICD-10-CM | POA: Diagnosis not present

## 2021-04-22 ENCOUNTER — Ambulatory Visit (INDEPENDENT_AMBULATORY_CARE_PROVIDER_SITE_OTHER): Payer: 59 | Admitting: Internal Medicine

## 2021-04-22 ENCOUNTER — Other Ambulatory Visit: Payer: Self-pay

## 2021-04-22 ENCOUNTER — Encounter (HOSPITAL_BASED_OUTPATIENT_CLINIC_OR_DEPARTMENT_OTHER): Payer: Self-pay

## 2021-04-22 VITALS — BP 110/72 | HR 73 | Ht 72.0 in | Wt 263.0 lb

## 2021-04-22 DIAGNOSIS — G4733 Obstructive sleep apnea (adult) (pediatric): Secondary | ICD-10-CM

## 2021-04-22 DIAGNOSIS — I480 Paroxysmal atrial fibrillation: Secondary | ICD-10-CM

## 2021-04-22 DIAGNOSIS — D6869 Other thrombophilia: Secondary | ICD-10-CM | POA: Diagnosis not present

## 2021-04-22 DIAGNOSIS — I10 Essential (primary) hypertension: Secondary | ICD-10-CM | POA: Diagnosis not present

## 2021-04-22 MED ORDER — METOPROLOL SUCCINATE ER 25 MG PO TB24: 25.0000 mg | ORAL_TABLET | Freq: Every day | ORAL | 3 refills | Status: DC

## 2021-04-22 NOTE — Patient Instructions (Addendum)
Medication Instructions:  Stop Diltiazem In 4 weeks reduce Metoprolol Succinate to 25 mg daily Your physician recommends that you continue on your current medications as directed. Please refer to the Current Medication list given to you today.  Labwork: None ordered.  Testing/Procedures: None ordered.  Follow-Up: Your physician wants you to follow-up in: As needed with Dr. Johney Frame.   Any Other Special Instructions Will Be Listed Below (If Applicable).  If you need a refill on your cardiac medications before your next appointment, please call your pharmacy.

## 2021-04-22 NOTE — Progress Notes (Addendum)
PCP: Gwenlyn Found, MD Primary Cardiologist: Dr Rosemary Holms Primary EP: Dr Johney Frame  Jose Cain is a 60 y.o. male who presents today for routine electrophysiology followup.  Since last being seen in our clinic, the patient reports doing very well.  Today, he denies symptoms of palpitations, chest pain, shortness of breath,  lower extremity edema, dizziness, presyncope, or syncope.  The patient is otherwise without complaint today.   Past Medical History:  Diagnosis Date   Atypical atrial flutter (HCC)    Cardiomyopathy (HCC)    Diabetes mellitus with albuminuria (HCC)    Diabetes mellitus without complication (HCC)    Diabetic retinopathy (HCC)    Hypertension    Kidney stone    Mixed hyperlipidemia    Nephrolithiasis    Obesity    OSA (obstructive sleep apnea)    Persistent atrial fibrillation (HCC)    Past Surgical History:  Procedure Laterality Date   ATRIAL FIBRILLATION ABLATION N/A 01/30/2018   Procedure: ATRIAL FIBRILLATION ABLATION;  Surgeon: [name], MD;  Location: [facility];  Service: Cardiovascular;  Laterality: N/A;   ATRIAL FIBRILLATION ABLATION N/A 07/23/2019   Procedure: ATRIAL FIBRILLATION ABLATION;  Surgeon: [name], MD;  Location: [facility];  Service: Cardiovascular;  Laterality: N/A;   CARDIOVERSION N/A 12/12/2017   Procedure: CARDIOVERSION;  Surgeon: Elder Negus, MD;  Location: [facility] ENDOSCOPY;  Service: Cardiovascular;  Laterality: N/A;   CARDIOVERSION N/A 01/08/2018   Procedure: CARDIOVERSION;  Surgeon: Elder Negus, MD;  Location: [facility] ENDOSCOPY;  Service: Cardiovascular;  Laterality: N/A;   CARDIOVERSION N/A 06/11/2019   Procedure: CARDIOVERSION;  Surgeon: Elder Negus, MD;  Location: [facility] ENDOSCOPY;  Service: Cardiovascular;  Laterality: N/A;   implantable loop recorder implantation  05/20/2019   Medtronic Reveal [place] model LNQ22 (SN [id number]) implantable loop recorder implanted by Dr Johney Frame for afib  management post ablation   LITHOTRIPSY     TEE WITHOUT CARDIOVERSION N/A 12/12/2017   Procedure: TRANSESOPHAGEAL ECHOCARDIOGRAM (TEE);  Surgeon: Elder Negus, MD;  Location: [facility] ENDOSCOPY;  Service: Cardiovascular;  Laterality: N/A;    ROS- all systems are reviewed and negatives except as per HPI above  Current Outpatient Medications  Medication Sig Dispense Refill   acetaminophen (TYLENOL) 500 MG tablet Take 500-1,000 mg by mouth every 6 (six) hours as needed (headches/pain.).     aspirin EC 81 MG tablet Take 1 tablet (81 mg total) by mouth daily. 90 tablet 3   diltiazem (CARTIA XT) 120 MG 24 hr capsule Take 1 capsule (120 mg total) by mouth daily. 90 capsule 3   Empagliflozin-metFORMIN HCl (SYNJARDY) 12-998 MG TABS Take 1 tablet by mouth 2 (two) times daily.     losartan (COZAAR) 50 MG tablet Take 50 mg by mouth every evening.      metoprolol succinate (TOPROL-XL) 50 MG 24 hr tablet Take 1 tablet (50 mg total) by mouth daily. Pt needs to make appt with Dr. Johney Frame for further refills - 1st attempt 30 tablet 0   Multiple Vitamin (MULTIVITAMIN WITH MINERALS) TABS tablet Take 1 tablet by mouth daily.     Omega 3 1200 MG CAPS Take 2,400 mg by mouth 2 (two) times daily.     rosuvastatin (CRESTOR) 20 MG tablet TAKE 1 TABLET BY MOUTH  DAILY 90 tablet 3   sildenafil (VIAGRA) 50 MG tablet Take 50 mg by mouth as needed for erectile dysfunction.     XARELTO 20 MG TABS tablet TAKE 1 TABLET BY MOUTH ONCE  DAILY 90 tablet 3   No current facility-administered medications for this visit.    Physical Exam: Vitals:   04/22/21 1556  BP: 110/72  Pulse: 73  SpO2: 97%  Weight: 263 lb (119.3 kg)  Height: 6' (1.829 m)    GEN- The patient is well appearing, alert and oriented x 3 today.   Head- normocephalic, atraumatic Eyes-  Sclera clear, conjunctiva pink Ears- hearing intact Oropharynx- clear Lungs- Clear to ausculation bilaterally, normal work of breathing Heart- Regular rate and rhythm, no  murmurs, rubs or gallops, PMI not laterally displaced GI- soft, NT, ND, + BS Extremities- no clubbing, cyanosis, or edema  Wt Readings from Last 3 Encounters:  04/22/21 263 lb (119.3 kg)  09/30/20 264 lb (119.7 kg)  04/03/20 271 lb (122.9 kg)    EKG tracing ordered today is personally reviewed and shows sinus rhythm 73 bpm, PR 214 msec, RBBB, LAD  Assessment and Plan:  Persistent atrial fibrillation Resolved s/p ablation (No afib by ILR) Chads2vasc score is 2.  He is on xarelto Stop diltiazem Reduce toprol to 25mg  daily Costs of ILR monitoring are quite high for him due to high insurance deductible.  He wishes to no longer be followed remotely.  I have therefore advised that he disconnect his ILR transmitter.  He is aware that we will not be able to determine real time arrhythmias however his device can be interrogated in our office or with Dr .  2. HTN Stop diltiazem as above Wean metoprolol to 25mg  daily  3. OSA Uses CPAP  4. Tachycardia mediated CM Resolved with sinus Dr Rosemary Holms note is reviewed  Risks, benefits and potential toxicities for medications prescribed and/or refilled reviewed with patient today.   He will follow with Dr going forward and I will see as needed.  He will contact my office with any concerns for arrhythmia or if he decides to have his ILR removed.  Lynwood Dawley MD, [facility] 04/22/2021 4:16 PM

## 2021-04-23 MED ORDER — METOPROLOL SUCCINATE ER 50 MG PO TB24: 50.0000 mg | ORAL_TABLET | Freq: Every day | ORAL | 0 refills | Status: DC

## 2021-04-26 LAB — CUP PACEART REMOTE DEVICE CHECK
Date Time Interrogation Session: 20220919113552
Implantable Pulse Generator Implant Date: 20201012

## 2021-04-28 NOTE — Progress Notes (Signed)
Carelink Summary Report / Loop Recorder 

## 2021-04-29 MED ORDER — METOPROLOL SUCCINATE ER 50 MG PO TB24: 50.0000 mg | ORAL_TABLET | Freq: Every day | ORAL | 0 refills | Status: DC

## 2021-04-29 NOTE — Addendum Note (Signed)
Addended by: Sampson Goon on: 04/29/2021 12:33 PM   Modules accepted: Orders

## 2021-04-29 NOTE — Telephone Encounter (Signed)
Discussed the tapering of medication with the pharmacy because they had two prescriptions on file for for 25 mg and one for 50 mg. Verbalized understanding and will send out extra tablets.   Called patient and advised that I had spoke with [facility] and the extra tablets should be getting filled now. Verbalized understanding.

## 2021-07-29 ENCOUNTER — Other Ambulatory Visit: Payer: Self-pay

## 2021-07-29 ENCOUNTER — Encounter (INDEPENDENT_AMBULATORY_CARE_PROVIDER_SITE_OTHER): Payer: 59 | Admitting: Ophthalmology

## 2021-07-29 DIAGNOSIS — H43813 Vitreous degeneration, bilateral: Secondary | ICD-10-CM

## 2021-07-29 DIAGNOSIS — E113293 Type 2 diabetes mellitus with mild nonproliferative diabetic retinopathy without macular edema, bilateral: Secondary | ICD-10-CM | POA: Diagnosis not present

## 2021-07-29 DIAGNOSIS — H35033 Hypertensive retinopathy, bilateral: Secondary | ICD-10-CM | POA: Diagnosis not present

## 2021-07-29 DIAGNOSIS — I10 Essential (primary) hypertension: Secondary | ICD-10-CM

## 2021-09-30 ENCOUNTER — Ambulatory Visit: Payer: 59 | Admitting: Cardiology

## 2021-09-30 ENCOUNTER — Encounter: Payer: Self-pay | Admitting: Cardiology

## 2021-09-30 ENCOUNTER — Other Ambulatory Visit: Payer: Self-pay

## 2021-09-30 VITALS — BP 128/96 | HR 104 | Temp 98.4°F | Resp 17 | Ht 72.0 in | Wt 260.6 lb

## 2021-09-30 DIAGNOSIS — I10 Essential (primary) hypertension: Secondary | ICD-10-CM

## 2021-09-30 DIAGNOSIS — I480 Paroxysmal atrial fibrillation: Secondary | ICD-10-CM

## 2021-09-30 NOTE — Progress Notes (Signed)
Subjective:   _0  ID: Jose Cain, male    DOB: [birth year], 61 y.o.   MRN: [id number]   No chief complaint on file.   HPI  77 Caucasian male with hypertension, controlled type II diabetes mellitus, moderate obesity, OSA on CPAP, paroxysmal Afib/flutter.  Patient is doing well, does not have any complaints today.  He is trying to walk more as the weather gets warmer. Reviewed recent lab results with the patient, details below.   Current Outpatient Medications on File Prior to Visit  Medication Sig Dispense Refill   acetaminophen (TYLENOL) 500 MG tablet Take 500-1,000 mg by mouth every 6 (six) hours as needed (headches/pain.).     aspirin EC 81 MG tablet Take 1 tablet (81 mg total) by mouth daily. 90 tablet 3   Empagliflozin-metFORMIN HCl (SYNJARDY) 12-998 MG TABS Take 1 tablet by mouth 2 (two) times daily.     losartan (COZAAR) 50 MG tablet Take 50 mg by mouth every evening.      metoprolol succinate (TOPROL XL) 25 MG 24 hr tablet Take 1 tablet (25 mg total) by mouth daily. 90 tablet 3   metoprolol succinate (TOPROL-XL) 50 MG 24 hr tablet Take 1 tablet (50 mg total) by mouth daily for 14 days. Take with or immediately following a meal. 14 tablet 0   Multiple Vitamin (MULTIVITAMIN WITH MINERALS) TABS tablet Take 1 tablet by mouth daily.     Omega 3 1200 MG CAPS Take 2,400 mg by mouth 2 (two) times daily.     rosuvastatin (CRESTOR) 20 MG tablet TAKE 1 TABLET BY MOUTH  DAILY 90 tablet 3   sildenafil (VIAGRA) 50 MG tablet Take 50 mg by mouth as needed for erectile dysfunction.     XARELTO 20 MG TABS tablet TAKE 1 TABLET BY MOUTH ONCE DAILY 90 tablet 3   No current facility-administered medications on file prior to visit.    Cardiovascular studies:   EKG 09/30/2021: Sinus rhythm 98 bpm Borderline first degree AV block RBB LAFB   Lexiscan Sestamibi stress test 08/07/2019: No previous exam available for comparison. Lexiscan/walking nuclear stress test performed using  1-day protocol. Rest and stress EKG show sinus rhythm/tachycardia, RBBB, LAFB.  SPECT images show medium sized, mild intensity, predominantly reversible, apical to basal, inferior/inferolateral perfusion defect, suggesting ischemia in LCx/PDA territory.  Stres LVEF 57%. Intermediate risk study.   CT Chest 07/19/2019: 1.  Moderate bi atrial enlargement No LAA thrombus 2.  Normal PV anatomy measurements above 3. Calcium score 508 involving all 3 major epicardial vessels consider f/u perfusion study. 4.  Normal aortic root 3.6 cm 5.  No ASD/PFO 6.  No pericardial effusion  EKG 06/03/2019: Atrial fibrillation 141 bpm.  Diffuse low voltage.  Right bundle branch block.  Left posterior fascicular block. Diffuse nonspecific T-abnormality.    Recent labs: 04/08/2021: Glucose 147, BUN/Cr 11/0.65. EGFR >90. Na/K 137/4.1. Rest of the CMP normal H/H 15/45. MCV 94. Platelets 231 Chol 107, TG 244, HDL 32, LDL 59  10/2020: HbA1C 6.4%  05/14/2020: Chol 103 TG 255, HDL 30, LDL 34  04/01/2020: H/H 15/45. MCV 95. Platelets 253 Chol 102, TG 330, HDL 30, direct LDL 48 HbA1C 6.9%  09/24/2019: Chol 98, TG 175, HDL 29, LDL 40  07/18/2019: Glucose 131, BUN/Cr 16/0.83. EGFR 98. Na/K 137/4.2. Rest of the CMP normal H/H 16/46. MCV 94. Platelets 247  03/27/2019: Chol 170, TG 315, HDL 30, LDL 125  02/16/2018:  HbA1c 6.9%.     Review of Systems  Cardiovascular:  Positive for leg swelling (Occasional). Negative for chest pain, dyspnea on exertion, palpitations and syncope.      Objective:    Vitals:   09/30/21 0938 09/30/21 0945  BP: (!) 144/89 (!) 128/96  Pulse: (!) 102 (!) 104  Resp: 17   Temp: 98.4 F (36.9 C)   SpO2:  98%     Physical Exam Vitals and nursing note reviewed.  Constitutional:      General: He is not in acute distress.    Appearance: He is well-developed.  Neck:     Vascular: No JVD.  Cardiovascular:     Rate and Rhythm: Normal rate and regular rhythm.      Pulses: Intact distal pulses.     Heart sounds: Normal heart sounds. No murmur heard. Pulmonary:     Effort: Pulmonary effort is normal.     Breath sounds: Normal breath sounds. No wheezing or rales.  Neurological:     Mental Status: He is alert and oriented to person, place, and time.        Assessment & Recommendations:    47 Caucasian male with hypertension, controlled type II diabetes mellitus, CAD, moderate obesity, OSA on CPAP, paroxysmal Afib/flutter s/p ablation  CAD without angina: Calcium score 508.SPECT images show medium sized, mild intensity, predominantly reversible, apical to basal, inferior/inferolateral perfusion defect, suggesting ischemia in LCx/PDA territory.  Patient is completely asymptomatic at this time. LVEF is normal. Therefore, I would continue aggressive medical management. He has tolerated aspirin and Xarelto well, without any bleeding.  LDL controlled.  Triglyceride remains high, but is elected to start any therapy for the same.  Encourage physical activity and weight loss, low carbohydrate and low-fat diet..   Continue metoprolol, diltiazem, losartan at current doses Continue Synjardy, which has empaglifozin.  Paroxysmal Afib/flutter: S/p repeat ablation by Dr. Rayann Heman in 07/2019. Maintaining sinus rhythm. Loop recorder followed by Dr. Rayann Heman.  CHA2DS2VAsc score 2, annual stroke risk 2%. Continue Xarelto 20 mg once daily.  Mixed hyperlipidemia: LDL well controlled.  Triglyceride 244.  Is reluctant to start Vascepa.  Would like to continue diet lifestyle modifications, especially weight loss and carbohydrate reduction.  OSA: Continue CPAP  Arrhythmia induced cardiomyopathy: Resolved after successful atrial fibrillation, flutter ablation.  EF of 55%.  Hypertension: Controlled.   F/u in 1 year   Nigel Mormon, MD [facility] Pager: [phone] Office: [phone] If no answer Cell [phone]

## 2021-11-05 ENCOUNTER — Encounter: Payer: Self-pay | Admitting: Cardiology

## 2022-02-11 ENCOUNTER — Other Ambulatory Visit (HOSPITAL_BASED_OUTPATIENT_CLINIC_OR_DEPARTMENT_OTHER): Payer: Self-pay | Admitting: Internal Medicine

## 2022-04-01 ENCOUNTER — Other Ambulatory Visit: Payer: Self-pay | Admitting: Cardiology

## 2022-04-01 DIAGNOSIS — E782 Mixed hyperlipidemia: Secondary | ICD-10-CM

## 2022-04-20 ENCOUNTER — Other Ambulatory Visit (HOSPITAL_BASED_OUTPATIENT_CLINIC_OR_DEPARTMENT_OTHER): Payer: Self-pay | Admitting: Internal Medicine

## 2022-04-20 ENCOUNTER — Other Ambulatory Visit: Payer: Self-pay | Admitting: Cardiology

## 2022-07-21 ENCOUNTER — Encounter (INDEPENDENT_AMBULATORY_CARE_PROVIDER_SITE_OTHER): Payer: 59 | Admitting: Ophthalmology

## 2022-07-21 DIAGNOSIS — I10 Essential (primary) hypertension: Secondary | ICD-10-CM | POA: Diagnosis not present

## 2022-07-21 DIAGNOSIS — H35033 Hypertensive retinopathy, bilateral: Secondary | ICD-10-CM | POA: Diagnosis not present

## 2022-07-21 DIAGNOSIS — H43813 Vitreous degeneration, bilateral: Secondary | ICD-10-CM

## 2022-07-21 DIAGNOSIS — E113293 Type 2 diabetes mellitus with mild nonproliferative diabetic retinopathy without macular edema, bilateral: Secondary | ICD-10-CM | POA: Diagnosis not present

## 2022-07-28 ENCOUNTER — Other Ambulatory Visit (HOSPITAL_BASED_OUTPATIENT_CLINIC_OR_DEPARTMENT_OTHER): Payer: Self-pay | Admitting: Internal Medicine

## 2022-09-30 ENCOUNTER — Ambulatory Visit: Payer: 59 | Admitting: Cardiology

## 2022-09-30 ENCOUNTER — Encounter: Payer: Self-pay | Admitting: Cardiology

## 2022-09-30 VITALS — BP 150/89 | HR 88 | Ht 72.0 in | Wt 245.6 lb

## 2022-09-30 DIAGNOSIS — I480 Paroxysmal atrial fibrillation: Secondary | ICD-10-CM

## 2022-09-30 DIAGNOSIS — I10 Essential (primary) hypertension: Secondary | ICD-10-CM

## 2022-09-30 DIAGNOSIS — I2510 Atherosclerotic heart disease of native coronary artery without angina pectoris: Secondary | ICD-10-CM

## 2022-09-30 MED ORDER — METOPROLOL SUCCINATE ER 50 MG PO TB24: 50.0000 mg | ORAL_TABLET | Freq: Every day | ORAL | 3 refills | Status: DC

## 2022-09-30 NOTE — Progress Notes (Signed)
Subjective:   '@Patient'$  ID: Jose Cain, male    DOB: May 27, 1961, 62 y.o.   MRN: [id number]   Chief Complaint  Patient presents with   Atrial Fibrillation   Follow-up    HPI  82 Caucasian male with hypertension, controlled type II diabetes mellitus, moderate obesity, OSA on CPAP, paroxysmal Afib/flutter.  Patient is doing well, does not have any complaints today.  Reviewed recent test results with the patient, details below.   Patient has a loop recorder placed by Dr. Rayann Heman at [facility], who has since moved elsewhere.  It appears that loop recorder was not being monitored due to insurance issues and absence of any recurrent palpitation symptoms.   Current Outpatient Medications:    acetaminophen (TYLENOL) 500 MG tablet, Take 500-1,000 mg by mouth every 6 (six) hours as needed (headches/pain.)., Disp: , Rfl:    Empagliflozin-metFORMIN HCl (SYNJARDY) 12-998 MG TABS, Take 1 tablet by mouth 2 (two) times daily., Disp: , Rfl:    losartan (COZAAR) 50 MG tablet, Take 50 mg by mouth every evening. , Disp: , Rfl:    metoprolol succinate (TOPROL-XL) 25 MG 24 hr tablet, Take 1 tablet (25 mg total) by mouth daily. Please keep upcoming appt,with Cardiologist in Feb to receive anymore refills. Thank You., Disp: 90 tablet, Rfl: 0   Multiple Vitamin (MULTIVITAMIN WITH MINERALS) TABS tablet, Take 1 tablet by mouth daily., Disp: , Rfl:    Omega 3 1200 MG CAPS, Take 2,400 mg by mouth 2 (two) times daily., Disp: , Rfl:    rosuvastatin (CRESTOR) 20 MG tablet, TAKE 1 TABLET BY MOUTH  DAILY, Disp: 90 tablet, Rfl: 3   sildenafil (VIAGRA) 50 MG tablet, Take 50 mg by mouth as needed for erectile dysfunction., Disp: , Rfl:    XARELTO 20 MG TABS tablet, TAKE 1 TABLET BY MOUTH ONCE DAILY, Disp: 90 tablet, Rfl: 3  Cardiovascular studies:   EKG 09/30/2022: Sinus rhythm 90 bpm Right bundle branch block Old inferior infarct  Lexiscan Sestamibi stress test 08/07/2019: No previous exam available for  comparison. Lexiscan/walking nuclear stress test performed using 1-day protocol. Rest and stress EKG show sinus rhythm/tachycardia, RBBB, LAFB.  SPECT images show medium sized, mild intensity, predominantly reversible, apical to basal, inferior/inferolateral perfusion defect, suggesting ischemia in LCx/PDA territory.  Stres LVEF 57%. Intermediate risk study.   CT Chest 07/19/2019: 1.  Moderate bi atrial enlargement No LAA thrombus 2.  Normal PV anatomy measurements above 3. Calcium score 508 involving all 3 major epicardial vessels consider f/u perfusion study. 4.  Normal aortic root 3.6 cm 5.  No ASD/PFO 6.  No pericardial effusion  EKG 06/03/2019: Atrial fibrillation 141 bpm.  Diffuse low voltage.  Right bundle branch block.  Left posterior fascicular block. Diffuse nonspecific T-abnormality.    Recent labs: 05/25/2022: Glucose 119, BUN/Cr 13/0.7. EGFR >90. Na/K 139/4.2. Rest of the CMP normal HbA1C 6.1% Chol 96, TG 212, HDL 33, LDL 48  09/24/2019: Chol 98, TG 175, HDL 29, LDL 40   Review of Systems  Cardiovascular:  Positive for leg swelling (Occasional). Negative for chest pain, dyspnea on exertion, palpitations and syncope.       Objective:    Vitals:   09/30/22 0830 09/30/22 0839  BP: (!) 154/91 (!) 150/89  Pulse: 88   SpO2: 98% 98%     Physical Exam Vitals and nursing note reviewed.  Constitutional:      General: He is not in acute distress.    Appearance: He is  well-developed.  Neck:     Vascular: No JVD.  Cardiovascular:     Rate and Rhythm: Normal rate and regular rhythm.     Pulses: Intact distal pulses.     Heart sounds: Normal heart sounds. No murmur heard. Pulmonary:     Effort: Pulmonary effort is normal.     Breath sounds: Normal breath sounds. No wheezing or rales.  Neurological:     Mental Status: He is alert and oriented to person, place, and time.         Assessment & Recommendations:    57 Caucasian male with hypertension,  controlled type II diabetes mellitus, CAD, moderate obesity, OSA on CPAP, paroxysmal Afib/flutter s/p ablation  CAD without angina: Calcium score 508.SPECT images show medium sized, mild intensity, predominantly reversible, apical to basal, inferior/inferolateral perfusion defect, suggesting ischemia in LCx/PDA territory (07/2019).  Patient is completely asymptomatic at this time. LVEF is normal. Therefore, I would continue aggressive medical management. He has tolerated aspirin and Xarelto well, without any bleeding.  LDL controlled.  Triglyceride remains high, but he again prefers no additional medications. Encourage physical activity and weight loss, low carbohydrate and low-fat diet..   Continue metoprolol, diltiazem, losartan at current doses Continue Synjardy, which has empaglifozin.  Paroxysmal Afib/flutter: S/p repeat ablation by Dr. Rayann Heman in 07/2019. Maintaining sinus rhythm. Loop recorder not being monitored currently, due to absence of symptoms and insurance issues.  CHA2DS2VAsc score 2, annual stroke risk 2%. Continue Xarelto 20 mg once daily.  Mixed hyperlipidemia: Chol 96, TG 212, HDL 33, LDL 48 (05/2022). Continue statin, does not want to start Vascepa at this time. Physical activity.  OSA: Continue CPAP  Arrhythmia induced cardiomyopathy: Resolved after successful atrial fibrillation, flutter ablation.  EF of 55%.  Hypertension: BP elevated today. Will increase metoprolol succinate to 50 mg daily. Patient has follow-up with PCP soon, where this can be further addressed.  F/u in 1 year    Nigel Mormon, MD Pager: [phone] Office: [phone]

## 2022-10-01 ENCOUNTER — Encounter: Payer: Self-pay | Admitting: Cardiology

## 2022-10-03 ENCOUNTER — Other Ambulatory Visit: Payer: Self-pay

## 2022-10-03 MED ORDER — METOPROLOL SUCCINATE ER 50 MG PO TB24: 50.0000 mg | ORAL_TABLET | Freq: Every day | ORAL | 3 refills | Status: DC

## 2022-10-03 NOTE — Telephone Encounter (Signed)
From patient

## 2022-10-03 NOTE — Telephone Encounter (Signed)
Thank you for yor message. Lets' keep an eye on the blood pressure. Please reroute all prescriptions sent at last visit to [facility].  Thanks MJP

## 2022-10-04 ENCOUNTER — Other Ambulatory Visit: Payer: Self-pay | Admitting: *Deleted

## 2022-12-04 ENCOUNTER — Encounter: Payer: Self-pay | Admitting: Cardiology

## 2022-12-05 NOTE — Telephone Encounter (Signed)
From patient.

## 2022-12-06 ENCOUNTER — Other Ambulatory Visit: Payer: Self-pay | Admitting: Cardiology

## 2022-12-06 MED ORDER — METOPROLOL SUCCINATE ER 25 MG PO TB24: 25.0000 mg | ORAL_TABLET | Freq: Every day | ORAL | 3 refills | Status: DC

## 2023-04-13 ENCOUNTER — Other Ambulatory Visit: Payer: Self-pay | Admitting: Cardiology

## 2023-04-13 DIAGNOSIS — E782 Mixed hyperlipidemia: Secondary | ICD-10-CM

## 2023-05-06 ENCOUNTER — Other Ambulatory Visit: Payer: Self-pay | Admitting: Cardiology

## 2023-05-08 NOTE — Telephone Encounter (Signed)
Prescription refill request for Xarelto received.  Indication:afib Last office visit:2/24 Weight:111.4  kg Age:63 Scr:0.62  4/24 CrCl:197.15  ml/min  Prescription refilled

## 2023-07-20 ENCOUNTER — Encounter (INDEPENDENT_AMBULATORY_CARE_PROVIDER_SITE_OTHER): Payer: 59 | Admitting: Ophthalmology

## 2023-07-20 DIAGNOSIS — I10 Essential (primary) hypertension: Secondary | ICD-10-CM

## 2023-07-20 DIAGNOSIS — H43813 Vitreous degeneration, bilateral: Secondary | ICD-10-CM

## 2023-07-20 DIAGNOSIS — H35033 Hypertensive retinopathy, bilateral: Secondary | ICD-10-CM

## 2023-07-20 DIAGNOSIS — E113293 Type 2 diabetes mellitus with mild nonproliferative diabetic retinopathy without macular edema, bilateral: Secondary | ICD-10-CM

## 2023-07-20 DIAGNOSIS — Z7984 Long term (current) use of oral hypoglycemic drugs: Secondary | ICD-10-CM

## 2023-07-22 ENCOUNTER — Other Ambulatory Visit: Payer: Self-pay | Admitting: Cardiology

## 2023-09-26 ENCOUNTER — Encounter: Payer: Self-pay | Admitting: Cardiology

## 2023-09-26 ENCOUNTER — Ambulatory Visit: Payer: 59 | Attending: Cardiology | Admitting: Cardiology

## 2023-09-26 VITALS — BP 130/84 | HR 96 | Ht 72.0 in | Wt 247.0 lb

## 2023-09-26 DIAGNOSIS — I480 Paroxysmal atrial fibrillation: Secondary | ICD-10-CM | POA: Diagnosis not present

## 2023-09-26 DIAGNOSIS — I10 Essential (primary) hypertension: Secondary | ICD-10-CM | POA: Diagnosis not present

## 2023-09-26 DIAGNOSIS — I2510 Atherosclerotic heart disease of native coronary artery without angina pectoris: Secondary | ICD-10-CM | POA: Diagnosis not present

## 2023-09-26 MED ORDER — METOPROLOL SUCCINATE ER 25 MG PO TB24: 25.0000 mg | ORAL_TABLET | Freq: Every day | ORAL | 3 refills | Status: AC

## 2023-09-26 NOTE — Progress Notes (Signed)
Cardiology Office Note:  .   Date:  09/26/2023  ID:  [name], DOB [birth year], MRN [id number] PCP: Gwenlyn Found, MD  [facility] Providers Cardiologist:  Truett Mainland, MD PCP: Gwenlyn Found, MD  Chief Complaint  Patient presents with   Coronary artery disease involving native coronary artery of   Paroxysmal atrial fibrillation   Follow-up      History of Present Illness: Marland Kitchen    Rue Tinnel is a 63 y.o. male with hypertension, controlled type II diabetes mellitus, moderate obesity, OSA on CPAP, paroxysmal Afib/flutter.   Patient is doing well.  He exercises, but walking on treadmill 30 minutes every day without any complaints of chest pain or shortness of breath.  He is compliant with his medical therapy.  He denies any bleeding issues.  He has not noticed any palpitations.  Reviewed lab results within 1 year with the patient, details below.  Patient has upcoming PCP visit in next few weeks.   Vitals:   09/26/23 0842 09/26/23 0901  BP: (!) 140/88 130/84  Pulse: 96   SpO2: 98%      ROS:  Review of Systems  Cardiovascular:  Negative for chest pain, dyspnea on exertion, leg swelling, palpitations and syncope.     Studies Reviewed: Marland Kitchen   EKG Interpretation Date/Time:  Tuesday September 26 2023 08:45:11 EST Ventricular Rate:  96 PR Interval:  228 QRS Duration:  132 QT Interval:  390 QTC Calculation: 492 R Axis:   -52  Text Interpretation: EKG 09/26/2023: Sinus or ectopic atrial rhythm 96 bpm First degree A-V block Left axis deviation Left anterior fascicular block Right bundle branch block Inferior infarct (cited on or before When compared with ECG of When compared with ECG of 20-Aug-2019 09:06, No significant change was found Confirmed by Truett Mainland [phone]) on 09/26/2023 8:47:53 AM    EKG 09/26/2023: Sinus or ectopic atrial rhythm 96 bpm First degree A-V block Left axis deviation Left anterior fascicular block Right bundle branch block  (cited on or beforeInferior infarct (cited on or before When compared with ECG of When compared with ECG of 20-Aug-2019 09:06, No significant change was found  Echocardiogram 06/28/2019: Left ventricle cavity is normal in size. Moderate concentric hypertrophy of the left ventricle. Normal global wall motion. Low normal LV systolic function. LVEF 50-55%. Unable to evaluate diastolic function due to atrial fibrillation. Calculated EF 51%. Left atrial cavity is mildly dilated, measuring 40 cc/m2. Moderate (Grade II) mitral regurgitation. Moderate tricuspid regurgitation. Mild pulmonary hypertension. Estimated pulmonary artery systolic pressure is 34 mmHg. Compared to previous study on 04/12/2018, mild LA dilatation, moderate mitral and tricuspid regurgitation, and mild pulmonary hypertension are new and likely reflect changes from recurrent atrial fibrillation.     Independently interpreted 05/2023: HbA1C 6.2%  11/2022: Chol 100, TG 170, HDL 31, LDL 44 Cr 0.62 T. Bili 1.6.   Risk Assessment/Calculations:    CHA2DS2-VASc Score =    This indicates a  % annual risk of stroke. The patient's score is based upon: HTN History: 1 Diabetes History: 1 Stroke History: 0 Vascular Disease History: 0 Age Score: 0 Gender Score: 0         Physical Exam:   Physical Exam Vitals and nursing note reviewed.  Constitutional:      General: He is not in acute distress. Neck:     Vascular: No JVD.  Cardiovascular:     Rate and Rhythm: Normal rate and regular rhythm.     Heart sounds: Normal heart  sounds. No murmur heard. Pulmonary:     Effort: Pulmonary effort is normal.     Breath sounds: Normal breath sounds. No wheezing or rales.  Musculoskeletal:     Right lower leg: No edema.     Left lower leg: No edema.      VISIT DIAGNOSES:   ICD-10-CM   1. Coronary artery disease involving native coronary artery of native heart without angina pectoris  I25.10 EKG 12-Lead    2.  Paroxysmal atrial fibrillation (HCC)  I48.0 ECHOCARDIOGRAM COMPLETE    3. Primary hypertension  I10 metoprolol succinate (TOPROL-XL) 25 MG 24 hr tablet       ASSESSMENT AND PLAN: .    Lonny Eisen is a 63 y.o. male with hypertension, controlled type II diabetes mellitus, CAD, moderate obesity, OSA on CPAP, paroxysmal Afib/flutter s/p ablation   CAD: Calcium score 508.SPECT images show medium sized, mild intensity, predominantly reversible, apical to basal, inferior/inferolateral perfusion defect, suggesting ischemia in LCx/PDA territory (07/2019).  Patient is completely asymptomatic at this time. LVEF is normal. Therefore, I would continue aggressive medical management. Given ongoing use of Xarelto, he is not on aspirin. Lipids well-controlled, with recent improvement in triglycerides as well.  Continue rosuvastatin 20 mg daily.  Continue metoprolol, losartan at current doses Continue Synjardy, which has empaglifozin.   Paroxysmal Afib/flutter: S/p repeat ablation by Dr. Johney Frame in 07/2019. Maintaining sinus rhythm. Loop recorder not being monitored currently, due to absence of symptoms and insurance issues.  CHA2DS2VAsc score 2, annual stroke risk 2%. Continue Xarelto 20 mg once daily.   Mixed hyperlipidemia: Chol 100, TG 170, HDL 31, LDL 44 (09/2022). Continue rosuvastatin 20 mg daily.  OSA: Continue CPAP   Arrhythmia induced cardiomyopathy: Resolved after successful atrial fibrillation, flutter ablation.  EF of 55%. Since his last echocardiogram was 5 years ago, I will obtain echocardiogram now to assess his EF and chamber sizes.  Hypertension: Generally well-controlled. No changes made today.      Meds ordered this encounter  Medications   metoprolol succinate (TOPROL-XL) 25 MG 24 hr tablet    Sig: Take 1 tablet (25 mg total) by mouth daily.    Dispense:  90 tablet    Refill:  3     F/u in 1 year  Signed, Elder Negus, MD

## 2023-09-26 NOTE — Patient Instructions (Signed)
 Medication Instructions:   Your physician recommends that you continue on your current medications as directed. Please refer to the Current Medication list given to you today.  *If you need a refill on your cardiac medications before your next appointment, please call your pharmacy*    Testing/Procedures:  Your physician has requested that you have an echocardiogram. Echocardiography is a painless test that uses sound waves to create images of your heart. It provides your doctor with information about the size and shape of your heart and how well your heart's chambers and valves are working. This procedure takes approximately one hour. There are no restrictions for this procedure. Please do NOT wear cologne, perfume, aftershave, or lotions (deodorant is allowed). Please arrive 15 minutes prior to your appointment time.  Please note: We ask at that you not bring children with you during ultrasound (echo/ vascular) testing. Due to room size and safety concerns, children are not allowed in the ultrasound rooms during exams. Our front office staff cannot provide observation of children in our lobby area while testing is being conducted. An adult accompanying a patient to their appointment will only be allowed in the ultrasound room at the discretion of the ultrasound technician under special circumstances. We apologize for any inconvenience.    Follow-Up: At [facility], you and your health needs are our priority.  As part of our continuing mission to provide you with exceptional heart care, we have created designated Provider Care Teams.  These Care Teams include your primary Cardiologist (physician) and Advanced Practice Providers (APPs -  Physician Assistants and Nurse Practitioners) who all work together to provide you with the care you need, when you need it.  We recommend signing up for the patient portal called "MyChart".  Sign up information is provided on this After Visit Summary.   MyChart is used to connect with patients for Virtual Visits (Telemedicine).  Patients are able to view lab/test results, encounter notes, upcoming appointments, etc.  Non-urgent messages can be sent to your provider as well.   To learn more about what you can do with MyChart, go to [email or web address].    Your next appointment:   1 year(s)  Provider:   Dr. Rosemary Holms

## 2023-10-02 ENCOUNTER — Ambulatory Visit: Payer: Self-pay | Admitting: Cardiology

## 2023-10-23 ENCOUNTER — Ambulatory Visit (HOSPITAL_COMMUNITY): Payer: 59 | Attending: Cardiology

## 2023-10-23 DIAGNOSIS — I361 Nonrheumatic tricuspid (valve) insufficiency: Secondary | ICD-10-CM | POA: Diagnosis not present

## 2023-10-23 DIAGNOSIS — I340 Nonrheumatic mitral (valve) insufficiency: Secondary | ICD-10-CM | POA: Diagnosis not present

## 2023-10-23 DIAGNOSIS — I480 Paroxysmal atrial fibrillation: Secondary | ICD-10-CM | POA: Insufficient documentation

## 2023-10-23 LAB — ECHOCARDIOGRAM COMPLETE
Area-P 1/2: 2.66 cm2
S' Lateral: 3.4 cm

## 2023-10-24 ENCOUNTER — Encounter: Payer: Self-pay | Admitting: Cardiology

## 2024-03-02 ENCOUNTER — Other Ambulatory Visit: Payer: Self-pay | Admitting: Cardiology

## 2024-03-02 ENCOUNTER — Encounter: Payer: Self-pay | Admitting: Cardiology

## 2024-03-02 DIAGNOSIS — E782 Mixed hyperlipidemia: Secondary | ICD-10-CM

## 2024-03-04 ENCOUNTER — Other Ambulatory Visit: Payer: Self-pay

## 2024-03-04 MED ORDER — ROSUVASTATIN CALCIUM 20 MG PO TABS: 20.0000 mg | ORAL_TABLET | Freq: Every day | ORAL | 3 refills | Status: AC

## 2024-03-04 MED ORDER — RIVAROXABAN 20 MG PO TABS
20.0000 mg | ORAL_TABLET | Freq: Every day | ORAL | 3 refills | Status: AC
Start: 2024-03-04 — End: ?

## 2024-03-04 NOTE — Telephone Encounter (Signed)
 Prescription refill request for Xarelto  received.  Indication:afib Last office visit:2/25 Weight:112  kg Age:63 Scr:0.56  5/25 CrCl:216.67  ml/min  Prescription refilled

## 2024-07-19 ENCOUNTER — Encounter (INDEPENDENT_AMBULATORY_CARE_PROVIDER_SITE_OTHER): Payer: 59 | Admitting: Ophthalmology

## 2024-07-19 DIAGNOSIS — I10 Essential (primary) hypertension: Secondary | ICD-10-CM

## 2024-07-19 DIAGNOSIS — Z7984 Long term (current) use of oral hypoglycemic drugs: Secondary | ICD-10-CM

## 2024-07-19 DIAGNOSIS — H43813 Vitreous degeneration, bilateral: Secondary | ICD-10-CM | POA: Diagnosis not present

## 2024-07-19 DIAGNOSIS — E113293 Type 2 diabetes mellitus with mild nonproliferative diabetic retinopathy without macular edema, bilateral: Secondary | ICD-10-CM | POA: Diagnosis not present

## 2024-07-19 DIAGNOSIS — H35033 Hypertensive retinopathy, bilateral: Secondary | ICD-10-CM | POA: Diagnosis not present

## 2025-07-21 ENCOUNTER — Encounter (INDEPENDENT_AMBULATORY_CARE_PROVIDER_SITE_OTHER): Admitting: Ophthalmology
